# Patient Record
Sex: Male | Born: 1995 | Race: Black or African American | Hispanic: No | Marital: Single | Smoking: Never smoker
Health system: Southern US, Community
[De-identification: ages and names within clinical notes are randomized; demographics above are authoritative.]

## PROBLEM LIST (undated history)

## (undated) DIAGNOSIS — R6250 Unspecified lack of expected normal physiological development in childhood: Secondary | ICD-10-CM

## (undated) DIAGNOSIS — L309 Dermatitis, unspecified: Secondary | ICD-10-CM

## (undated) DIAGNOSIS — E049 Nontoxic goiter, unspecified: Secondary | ICD-10-CM

## (undated) DIAGNOSIS — E11649 Type 2 diabetes mellitus with hypoglycemia without coma: Secondary | ICD-10-CM

## (undated) HISTORY — DX: Type 2 diabetes mellitus with hypoglycemia without coma: E11.649

## (undated) HISTORY — DX: Nontoxic goiter, unspecified: E04.9

## (undated) HISTORY — DX: Unspecified lack of expected normal physiological development in childhood: R62.50

## (undated) HISTORY — DX: Dermatitis, unspecified: L30.9

---
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
## 2017-05-26 ENCOUNTER — Encounter: Payer: Self-pay | Admitting: Primary Care

## 2017-05-26 ENCOUNTER — Ambulatory Visit (INDEPENDENT_AMBULATORY_CARE_PROVIDER_SITE_OTHER): Payer: 59 | Admitting: Primary Care

## 2017-05-26 VITALS — BP 118/76 | HR 81 | Temp 98.2°F

## 2017-05-26 DIAGNOSIS — E109 Type 1 diabetes mellitus without complications: Secondary | ICD-10-CM

## 2017-05-26 LAB — HEMOGLOBIN A1C: Hgb A1c MFr Bld: 9.1 % — ABNORMAL HIGH (ref 4.6–6.5)

## 2017-05-26 NOTE — Patient Instructions (Signed)
Stop by the front desk and speak with either Shirlee Limerick or Zella Ball regarding your referral to endocrinology.  Complete lab work prior to leaving today. I will notify you of your results once received.   It was a pleasure to meet you today! Please don't hesitate to call me with any questions. Welcome to [facility]!

## 2017-05-26 NOTE — Progress Notes (Signed)
   Subjective:    Patient ID: Fred Acevedo, male    DOB: 28-Feb-1996, 21 y.o.   MRN: [id number]  HPI  Fred Acevedo is a 21 year old male who presents today to establish care and discuss the problems mentioned below. Will obtain old records. He moved from [place] recently.  1) Type 1 Diabetes: Currently manage on Lantus 50 units at bedtime and Novolog 1 unit per 8 carbs three times daily. His last A1C was in May 2018 and was 10.   He's checking his blood sugars twice every two days (fasting in the morning, and sometime during the day before a meal). His sugars are ranging low 100's to mid 200's. His highest blood sugar has been 280 and his lowest sugar as been 107. He denies chest pain, SOB, numbness/tingling, dizziness.   Diet currently consists of:  Breakfast: Eggs, bacon, grits Lunch: Sandwiches Dinner: Chicken, fish, veggies, rice Snacks: Cookies, fruit, crackers Desserts: 2-3 times weekly Beverages: Water mostly, crystal light, occasional sweet tea  Exercise: He exercises at the gym twice weekly.     Review of Systems  Eyes: Negative for visual disturbance.  Respiratory: Negative for shortness of breath.   Cardiovascular: Negative for chest pain.  Skin: Negative for color change.  Neurological: Negative for dizziness, weakness, numbness and headaches.       Past Medical History:  Diagnosis Date  . Diabetes mellitus   . Eczema   . Goiter   . Hypoglycemia associated with diabetes (HCC)   . Hypoglycemia associated with diabetes (HCC)   . Physical growth delay      Social History   Social History  . Marital status: Single    Spouse name: N/A  . Number of children: N/A  . Years of education: N/A   Occupational History  . Not on file.   Social History Main Topics  . Smoking status: Never Smoker  . Smokeless tobacco: Never Used  . Alcohol use Not on file  . Drug use: Unknown  . Sexual activity: Not on file   Other Topics Concern  . Not on file   Social  History Narrative   Lives with great aunt.    Moved back form [place].   Works at [employer].   Enjoys playing video games.     No past surgical history on file.  Family History  Problem Relation Age of Onset  . Stroke Mother   . Sickle cell anemia Mother   . Diabetes Maternal Grandmother   . Hypothyroidism Maternal Grandmother   . Tourette syndrome Brother   . Lupus Sister     No Known Allergies  No current outpatient prescriptions on file prior to visit.   No current facility-administered medications on file prior to visit.     BP 118/76   Pulse 81   Temp 98.2 F (36.8 C) (Oral)   SpO2 96%    Objective:   Physical Exam  Constitutional: He is oriented to person, place, and time. He appears well-nourished.  Neck: Neck supple.  Cardiovascular: Normal rate and regular rhythm.   Pulmonary/Chest: Effort normal and breath sounds normal. He has no wheezes. He has no rales.  Neurological: He is alert and oriented to person, place, and time.  Skin: Skin is warm and dry.  Psychiatric: He has a normal mood and affect.          Assessment & Plan:

## 2017-05-26 NOTE — Assessment & Plan Note (Signed)
A1C of 10 in May 2018 per patient.  Blood sugars seem mostly controlled in the low to mid 100's, intermittent numbers in the 200's.  Check A1C today. Referral placed to endocrinology for establishment and treatment.  Discussed to work on diet, increase exercise. Discussed to start monitoring sugars QID.

## 2017-05-27 ENCOUNTER — Encounter: Payer: Self-pay | Admitting: *Deleted

## 2024-04-12 ENCOUNTER — Emergency Department (HOSPITAL_COMMUNITY): Payer: Self-pay

## 2024-04-12 ENCOUNTER — Encounter (HOSPITAL_COMMUNITY): Payer: Self-pay

## 2024-04-12 ENCOUNTER — Other Ambulatory Visit: Payer: Self-pay

## 2024-04-12 ENCOUNTER — Inpatient Hospital Stay (HOSPITAL_COMMUNITY)
Admission: EM | Admit: 2024-04-12 | Discharge: 2024-04-15 | DRG: 637 | Disposition: A | Discharge status: Home / Self Care | Payer: Self-pay | Attending: Internal Medicine | Admitting: Internal Medicine

## 2024-04-12 DIAGNOSIS — R000 Tachycardia, unspecified: Secondary | ICD-10-CM | POA: Diagnosis present

## 2024-04-12 DIAGNOSIS — R4182 Altered mental status, unspecified: Secondary | ICD-10-CM | POA: Diagnosis not present

## 2024-04-12 DIAGNOSIS — Z5902 Unsheltered homelessness: Secondary | ICD-10-CM

## 2024-04-12 DIAGNOSIS — E1011 Type 1 diabetes mellitus with ketoacidosis with coma: Principal | ICD-10-CM | POA: Diagnosis present

## 2024-04-12 DIAGNOSIS — Z832 Family history of diseases of the blood and blood-forming organs and certain disorders involving the immune mechanism: Secondary | ICD-10-CM

## 2024-04-12 DIAGNOSIS — E1065 Type 1 diabetes mellitus with hyperglycemia: Secondary | ICD-10-CM | POA: Diagnosis present

## 2024-04-12 DIAGNOSIS — L309 Dermatitis, unspecified: Secondary | ICD-10-CM | POA: Diagnosis present

## 2024-04-12 DIAGNOSIS — H547 Unspecified visual loss: Secondary | ICD-10-CM | POA: Diagnosis present

## 2024-04-12 DIAGNOSIS — E10649 Type 1 diabetes mellitus with hypoglycemia without coma: Secondary | ICD-10-CM | POA: Diagnosis not present

## 2024-04-12 DIAGNOSIS — E876 Hypokalemia: Secondary | ICD-10-CM | POA: Diagnosis not present

## 2024-04-12 DIAGNOSIS — J9601 Acute respiratory failure with hypoxia: Secondary | ICD-10-CM | POA: Diagnosis present

## 2024-04-12 DIAGNOSIS — Z823 Family history of stroke: Secondary | ICD-10-CM

## 2024-04-12 DIAGNOSIS — G9341 Metabolic encephalopathy: Secondary | ICD-10-CM | POA: Diagnosis present

## 2024-04-12 DIAGNOSIS — E049 Nontoxic goiter, unspecified: Secondary | ICD-10-CM | POA: Diagnosis present

## 2024-04-12 DIAGNOSIS — D72829 Elevated white blood cell count, unspecified: Secondary | ICD-10-CM | POA: Diagnosis present

## 2024-04-12 DIAGNOSIS — E875 Hyperkalemia: Secondary | ICD-10-CM | POA: Diagnosis present

## 2024-04-12 DIAGNOSIS — R509 Fever, unspecified: Secondary | ICD-10-CM | POA: Diagnosis present

## 2024-04-12 DIAGNOSIS — E1110 Type 2 diabetes mellitus with ketoacidosis without coma: Secondary | ICD-10-CM | POA: Diagnosis present

## 2024-04-12 DIAGNOSIS — Z794 Long term (current) use of insulin: Secondary | ICD-10-CM

## 2024-04-12 DIAGNOSIS — Z833 Family history of diabetes mellitus: Secondary | ICD-10-CM

## 2024-04-12 DIAGNOSIS — E1040 Type 1 diabetes mellitus with diabetic neuropathy, unspecified: Secondary | ICD-10-CM | POA: Diagnosis present

## 2024-04-12 DIAGNOSIS — N179 Acute kidney failure, unspecified: Secondary | ICD-10-CM | POA: Diagnosis present

## 2024-04-12 DIAGNOSIS — Z5971 Insufficient health insurance coverage: Secondary | ICD-10-CM

## 2024-04-12 LAB — CBC WITH DIFFERENTIAL/PLATELET
Basophils Absolute: 0 K/uL (ref 0.0–0.1)
Basophils Relative: 0 %
Eosinophils Absolute: 0 K/uL (ref 0.0–0.5)
Eosinophils Relative: 0 %
HCT: 42.9 % (ref 39.0–52.0)
Hemoglobin: 14.5 g/dL (ref 13.0–17.0)
Lymphocytes Relative: 6 %
Lymphs Abs: 1.8 K/uL (ref 0.7–4.0)
MCH: 30.4 pg (ref 26.0–34.0)
MCHC: 33.8 g/dL (ref 30.0–36.0)
MCV: 89.9 fL (ref 80.0–100.0)
Monocytes Absolute: 2.8 K/uL — ABNORMAL HIGH (ref 0.1–1.0)
Monocytes Relative: 9 %
Neutro Abs: 26 K/uL — ABNORMAL HIGH (ref 1.7–7.7)
Neutrophils Relative %: 85 %
Platelets: 296 K/uL (ref 150–400)
RBC: 4.77 MIL/uL (ref 4.22–5.81)
RDW: 11.2 % — ABNORMAL LOW (ref 11.5–15.5)
WBC: 30.6 K/uL — ABNORMAL HIGH (ref 4.0–10.5)
nRBC: 0 % (ref 0.0–0.2)

## 2024-04-12 LAB — COMPREHENSIVE METABOLIC PANEL WITH GFR
ALT: 29 U/L (ref 0–44)
AST: 33 U/L (ref 15–41)
Albumin: 4.6 g/dL (ref 3.5–5.0)
Alkaline Phosphatase: 76 U/L (ref 38–126)
BUN: 19 mg/dL (ref 6–20)
CO2: <7 mmol/L — ABNORMAL LOW (ref 22–32)
Calcium: 9.7 mg/dL (ref 8.9–10.3)
Chloride: 96 mmol/L — ABNORMAL LOW (ref 98–111)
Creatinine, Ser: 1.98 mg/dL — ABNORMAL HIGH (ref 0.61–1.24)
GFR, Estimated: 47 mL/min — ABNORMAL LOW (ref >60)
Glucose, Bld: 558 mg/dL (ref 70–99)
Potassium: 6 mmol/L — ABNORMAL HIGH (ref 3.5–5.1)
Sodium: 132 mmol/L — ABNORMAL LOW (ref 135–145)
Total Bilirubin: 2.6 mg/dL — ABNORMAL HIGH (ref 0.0–1.2)
Total Protein: 8 g/dL (ref 6.5–8.1)

## 2024-04-12 LAB — RAPID URINE DRUG SCREEN, HOSP PERFORMED
Amphetamines: NOT DETECTED
Barbiturates: NOT DETECTED
Benzodiazepines: NOT DETECTED
Cocaine: NOT DETECTED
Opiates: NOT DETECTED
Tetrahydrocannabinol: POSITIVE — AB

## 2024-04-12 LAB — BETA-HYDROXYBUTYRIC ACID
Beta-Hydroxybutyric Acid: >8 mmol/L — ABNORMAL HIGH (ref 0.05–0.27)
Beta-Hydroxybutyric Acid: 7.96 mmol/L — ABNORMAL HIGH (ref 0.05–0.27)
Beta-Hydroxybutyric Acid: 0.38 mmol/L — ABNORMAL HIGH (ref 0.05–0.27)

## 2024-04-12 LAB — CBC
HCT: 38.3 % — ABNORMAL LOW (ref 39.0–52.0)
Hemoglobin: 13 g/dL (ref 13.0–17.0)
MCH: 30.1 pg (ref 26.0–34.0)
MCHC: 33.9 g/dL (ref 30.0–36.0)
MCV: 88.7 fL (ref 80.0–100.0)
Platelets: 297 K/uL (ref 150–400)
RBC: 4.32 MIL/uL (ref 4.22–5.81)
RDW: 11.2 % — ABNORMAL LOW (ref 11.5–15.5)
WBC: 38.1 K/uL — ABNORMAL HIGH (ref 4.0–10.5)
nRBC: 0 % (ref 0.0–0.2)

## 2024-04-12 LAB — PHOSPHORUS: Phosphorus: 2 mg/dL — ABNORMAL LOW (ref 2.5–4.6)

## 2024-04-12 LAB — CBG MONITORING, ED
Glucose-Capillary: 530 mg/dL (ref 70–99)
Glucose-Capillary: 521 mg/dL (ref 70–99)
Glucose-Capillary: 586 mg/dL (ref 70–99)
Glucose-Capillary: 542 mg/dL (ref 70–99)
Glucose-Capillary: 592 mg/dL (ref 70–99)
Glucose-Capillary: 561 mg/dL (ref 70–99)
Glucose-Capillary: >600 mg/dL (ref 70–99)

## 2024-04-12 LAB — URINALYSIS, ROUTINE W REFLEX MICROSCOPIC
Bacteria, UA: NONE SEEN
Bilirubin Urine: NEGATIVE
Glucose, UA: >=500 mg/dL — AB
Ketones, ur: 80 mg/dL — AB
Leukocytes,Ua: NEGATIVE
Nitrite: NEGATIVE
Protein, ur: 100 mg/dL — AB
Specific Gravity, Urine: 1.013 (ref 1.005–1.030)
pH: 5 (ref 5.0–8.0)

## 2024-04-12 LAB — BASIC METABOLIC PANEL WITH GFR
Anion gap: 27 — ABNORMAL HIGH (ref 5–15)
Anion gap: 22 — ABNORMAL HIGH (ref 5–15)
Anion gap: 18 — ABNORMAL HIGH (ref 5–15)
Anion gap: 10 (ref 5–15)
Anion gap: 10 (ref 5–15)
BUN: 27 mg/dL — ABNORMAL HIGH (ref 6–20)
BUN: 29 mg/dL — ABNORMAL HIGH (ref 6–20)
BUN: 31 mg/dL — ABNORMAL HIGH (ref 6–20)
BUN: 26 mg/dL — ABNORMAL HIGH (ref 6–20)
BUN: 24 mg/dL — ABNORMAL HIGH (ref 6–20)
CO2: 7 mmol/L — ABNORMAL LOW (ref 22–32)
CO2: 10 mmol/L — ABNORMAL LOW (ref 22–32)
CO2: 13 mmol/L — ABNORMAL LOW (ref 22–32)
CO2: 18 mmol/L — ABNORMAL LOW (ref 22–32)
CO2: 18 mmol/L — ABNORMAL LOW (ref 22–32)
Calcium: 8.8 mg/dL — ABNORMAL LOW (ref 8.9–10.3)
Calcium: 8.9 mg/dL (ref 8.9–10.3)
Calcium: 8.6 mg/dL — ABNORMAL LOW (ref 8.9–10.3)
Calcium: 8.6 mg/dL — ABNORMAL LOW (ref 8.9–10.3)
Calcium: 8.6 mg/dL — ABNORMAL LOW (ref 8.9–10.3)
Chloride: 97 mmol/L — ABNORMAL LOW (ref 98–111)
Chloride: 100 mmol/L (ref 98–111)
Chloride: 99 mmol/L (ref 98–111)
Chloride: 108 mmol/L (ref 98–111)
Chloride: 109 mmol/L (ref 98–111)
Creatinine, Ser: 2.48 mg/dL — ABNORMAL HIGH (ref 0.61–1.24)
Creatinine, Ser: 2.54 mg/dL — ABNORMAL HIGH (ref 0.61–1.24)
Creatinine, Ser: 2.25 mg/dL — ABNORMAL HIGH (ref 0.61–1.24)
Creatinine, Ser: 1.54 mg/dL — ABNORMAL HIGH (ref 0.61–1.24)
Creatinine, Ser: 1.37 mg/dL — ABNORMAL HIGH (ref 0.61–1.24)
GFR, Estimated: 36 mL/min — ABNORMAL LOW (ref >60)
GFR, Estimated: 35 mL/min — ABNORMAL LOW (ref >60)
GFR, Estimated: 40 mL/min — ABNORMAL LOW (ref >60)
GFR, Estimated: >60 mL/min (ref >60)
GFR, Estimated: >60 mL/min (ref >60)
Glucose, Bld: 537 mg/dL (ref 70–99)
Glucose, Bld: 428 mg/dL — ABNORMAL HIGH (ref 70–99)
Glucose, Bld: 310 mg/dL — ABNORMAL HIGH (ref 70–99)
Glucose, Bld: 183 mg/dL — ABNORMAL HIGH (ref 70–99)
Glucose, Bld: 134 mg/dL — ABNORMAL HIGH (ref 70–99)
Potassium: 4.5 mmol/L (ref 3.5–5.1)
Potassium: 4 mmol/L (ref 3.5–5.1)
Potassium: 3.4 mmol/L — ABNORMAL LOW (ref 3.5–5.1)
Potassium: 4 mmol/L (ref 3.5–5.1)
Potassium: 5 mmol/L (ref 3.5–5.1)
Sodium: 131 mmol/L — ABNORMAL LOW (ref 135–145)
Sodium: 132 mmol/L — ABNORMAL LOW (ref 135–145)
Sodium: 130 mmol/L — ABNORMAL LOW (ref 135–145)
Sodium: 136 mmol/L (ref 135–145)
Sodium: 137 mmol/L (ref 135–145)

## 2024-04-12 LAB — POCT I-STAT 7, (LYTES, BLD GAS, ICA,H+H)
Acid-base deficit: 22 mmol/L — ABNORMAL HIGH (ref 0.0–2.0)
Bicarbonate: 6 mmol/L — ABNORMAL LOW (ref 20.0–28.0)
Calcium, Ion: 1.21 mmol/L (ref 1.15–1.40)
HCT: 39 % (ref 39.0–52.0)
Hemoglobin: 13.3 g/dL (ref 13.0–17.0)
O2 Saturation: 100 %
Patient temperature: 97.6
Potassium: 4.5 mmol/L (ref 3.5–5.1)
Sodium: 130 mmol/L — ABNORMAL LOW (ref 135–145)
TCO2: 7 mmol/L — ABNORMAL LOW (ref 22–32)
pCO2 arterial: 20 mmHg — ABNORMAL LOW (ref 32–48)
pH, Arterial: 7.082 — CL (ref 7.35–7.45)
pO2, Arterial: 612 mmHg — ABNORMAL HIGH (ref 83–108)

## 2024-04-12 LAB — GLUCOSE, CAPILLARY
Glucose-Capillary: 509 mg/dL (ref 70–99)
Glucose-Capillary: 517 mg/dL (ref 70–99)
Glucose-Capillary: 457 mg/dL — ABNORMAL HIGH (ref 70–99)
Glucose-Capillary: 540 mg/dL (ref 70–99)
Glucose-Capillary: 466 mg/dL — ABNORMAL HIGH (ref 70–99)
Glucose-Capillary: 422 mg/dL — ABNORMAL HIGH (ref 70–99)
Glucose-Capillary: 424 mg/dL — ABNORMAL HIGH (ref 70–99)
Glucose-Capillary: 386 mg/dL — ABNORMAL HIGH (ref 70–99)
Glucose-Capillary: 317 mg/dL — ABNORMAL HIGH (ref 70–99)
Glucose-Capillary: 298 mg/dL — ABNORMAL HIGH (ref 70–99)
Glucose-Capillary: 224 mg/dL — ABNORMAL HIGH (ref 70–99)
Glucose-Capillary: 203 mg/dL — ABNORMAL HIGH (ref 70–99)
Glucose-Capillary: 213 mg/dL — ABNORMAL HIGH (ref 70–99)
Glucose-Capillary: 192 mg/dL — ABNORMAL HIGH (ref 70–99)
Glucose-Capillary: 168 mg/dL — ABNORMAL HIGH (ref 70–99)
Glucose-Capillary: 149 mg/dL — ABNORMAL HIGH (ref 70–99)
Glucose-Capillary: 133 mg/dL — ABNORMAL HIGH (ref 70–99)
Glucose-Capillary: 113 mg/dL — ABNORMAL HIGH (ref 70–99)
Glucose-Capillary: 103 mg/dL — ABNORMAL HIGH (ref 70–99)
Glucose-Capillary: 117 mg/dL — ABNORMAL HIGH (ref 70–99)
Glucose-Capillary: 129 mg/dL — ABNORMAL HIGH (ref 70–99)

## 2024-04-12 LAB — I-STAT CHEM 8, ED
BUN: 19 mg/dL (ref 6–20)
Calcium, Ion: 1.1 mmol/L — ABNORMAL LOW (ref 1.15–1.40)
Chloride: 105 mmol/L (ref 98–111)
Creatinine, Ser: 1.4 mg/dL — ABNORMAL HIGH (ref 0.61–1.24)
Glucose, Bld: 589 mg/dL (ref 70–99)
HCT: 45 % (ref 39.0–52.0)
Hemoglobin: 15.3 g/dL (ref 13.0–17.0)
Potassium: 5.7 mmol/L — ABNORMAL HIGH (ref 3.5–5.1)
Sodium: 132 mmol/L — ABNORMAL LOW (ref 135–145)
TCO2: 7 mmol/L — ABNORMAL LOW (ref 22–32)

## 2024-04-12 LAB — I-STAT VENOUS BLOOD GAS, ED
Acid-base deficit: 25 mmol/L — ABNORMAL HIGH (ref 0.0–2.0)
Bicarbonate: 4.3 mmol/L — ABNORMAL LOW (ref 20.0–28.0)
Calcium, Ion: 1.15 mmol/L (ref 1.15–1.40)
HCT: 46 % (ref 39.0–52.0)
Hemoglobin: 15.6 g/dL (ref 13.0–17.0)
O2 Saturation: 87 %
Potassium: 5.6 mmol/L — ABNORMAL HIGH (ref 3.5–5.1)
Sodium: 131 mmol/L — ABNORMAL LOW (ref 135–145)
TCO2: <5 mmol/L — ABNORMAL LOW (ref 22–32)
pCO2, Ven: 16.6 mmHg — CL (ref 44–60)
pH, Ven: 7.022 — CL (ref 7.25–7.43)
pO2, Ven: 76 mmHg — ABNORMAL HIGH (ref 32–45)

## 2024-04-12 LAB — HIV ANTIBODY (ROUTINE TESTING W REFLEX): HIV Screen 4th Generation wRfx: NONREACTIVE

## 2024-04-12 LAB — MAGNESIUM: Magnesium: 1.7 mg/dL (ref 1.7–2.4)

## 2024-04-12 LAB — MRSA NEXT GEN BY PCR, NASAL: MRSA by PCR Next Gen: NOT DETECTED

## 2024-04-12 LAB — TROPONIN I (HIGH SENSITIVITY)
Troponin I (High Sensitivity): 27 ng/L — ABNORMAL HIGH (ref <18)
Troponin I (High Sensitivity): 38 ng/L — ABNORMAL HIGH (ref <18)

## 2024-04-12 LAB — ETHANOL: Alcohol, Ethyl (B): <15 mg/dL (ref <15)

## 2024-04-12 MED ORDER — POTASSIUM CHLORIDE 10 MEQ/100ML IV SOLN
10.0000 meq | INTRAVENOUS | Status: AC
  Administered 2024-04-12 (×4): 10 meq via INTRAVENOUS
  Filled 2024-04-12 (×4): qty 100

## 2024-04-12 MED ORDER — MAGNESIUM SULFATE 2 GM/50ML IV SOLN
2.0000 g | Freq: Once | INTRAVENOUS | Status: AC
  Administered 2024-04-12: 2 g via INTRAVENOUS
  Filled 2024-04-12: qty 50

## 2024-04-12 MED ORDER — DIAZEPAM 5 MG/ML IJ SOLN
2.5000 mg | Freq: Once | INTRAMUSCULAR | Status: AC
  Administered 2024-04-12: 2.5 mg via INTRAVENOUS
  Filled 2024-04-12: qty 2

## 2024-04-12 MED ORDER — FAMOTIDINE 20 MG PO TABS
20.0000 mg | ORAL_TABLET | Freq: Two times a day (BID) | ORAL | Status: DC
  Administered 2024-04-12 – 2024-04-13 (×4): 20 mg
  Filled 2024-04-12 (×4): qty 1

## 2024-04-12 MED ORDER — HEPARIN SODIUM (PORCINE) 5000 UNIT/ML IJ SOLN: 5000.0000 [IU] | Freq: Three times a day (TID) | INTRAMUSCULAR | Status: DC

## 2024-04-12 MED ORDER — HEPARIN SODIUM (PORCINE) 5000 UNIT/ML IJ SOLN
5000.0000 [IU] | Freq: Three times a day (TID) | INTRAMUSCULAR | Status: DC
  Administered 2024-04-12: 5000 [IU] via SUBCUTANEOUS
  Filled 2024-04-12: qty 1

## 2024-04-12 MED ORDER — ORAL CARE MOUTH RINSE: 15.0000 mL | OROMUCOSAL | Status: DC | PRN

## 2024-04-12 MED ORDER — FENTANYL 2500MCG IN NS 250ML (10MCG/ML) PREMIX INFUSION
0.0000 ug/h | INTRAVENOUS | Status: DC
  Administered 2024-04-12: 150 ug/h via INTRAVENOUS
  Administered 2024-04-12: 25 ug/h via INTRAVENOUS
  Filled 2024-04-12 (×2): qty 250

## 2024-04-12 MED ORDER — LACTATED RINGERS IV BOLUS: 20.0000 mL/kg | Freq: Once | INTRAVENOUS | Status: DC

## 2024-04-12 MED ORDER — FENTANYL CITRATE PF 50 MCG/ML IJ SOSY
25.0000 ug | PREFILLED_SYRINGE | Freq: Once | INTRAMUSCULAR | Status: AC
  Administered 2024-04-12: 25 ug via INTRAVENOUS

## 2024-04-12 MED ORDER — LACTATED RINGERS IV SOLN: INTRAVENOUS | Status: DC

## 2024-04-12 MED ORDER — DEXTROSE IN LACTATED RINGERS 5 % IV SOLN: INTRAVENOUS | Status: DC

## 2024-04-12 MED ORDER — POTASSIUM & SODIUM PHOSPHATES 280-160-250 MG PO PACK
2.0000 | PACK | ORAL | Status: AC
  Administered 2024-04-12 – 2024-04-13 (×3): 2
  Filled 2024-04-12 (×3): qty 2

## 2024-04-12 MED ORDER — PROPOFOL 1000 MG/100ML IV EMUL
0.0000 ug/kg/min | INTRAVENOUS | Status: DC
  Administered 2024-04-12: 60 ug/kg/min via INTRAVENOUS
  Administered 2024-04-12 (×2): 70 ug/kg/min via INTRAVENOUS
  Administered 2024-04-12: 60 ug/kg/min via INTRAVENOUS
  Administered 2024-04-12 (×2): 70 ug/kg/min via INTRAVENOUS
  Administered 2024-04-12: 5 ug/kg/min via INTRAVENOUS
  Administered 2024-04-13: 20 ug/kg/min via INTRAVENOUS
  Filled 2024-04-12 (×6): qty 100
  Filled 2024-04-12: qty 200
  Filled 2024-04-12: qty 100

## 2024-04-12 MED ORDER — LACTATED RINGERS IV BOLUS
20.0000 mL/kg | Freq: Once | INTRAVENOUS | Status: AC
  Administered 2024-04-12: 1632 mL via INTRAVENOUS

## 2024-04-12 MED ORDER — DEXTROSE 50 % IV SOLN: 0.0000 mL | INTRAVENOUS | Status: DC | PRN

## 2024-04-12 MED ORDER — FENTANYL CITRATE PF 50 MCG/ML IJ SOSY
50.0000 ug | PREFILLED_SYRINGE | INTRAMUSCULAR | Status: DC | PRN
  Administered 2024-04-12: 100 ug via INTRAVENOUS
  Administered 2024-04-12: 200 ug via INTRAVENOUS
  Filled 2024-04-12: qty 3
  Filled 2024-04-12: qty 2

## 2024-04-12 MED ORDER — KCL-LACTATED RINGERS-D5W 20 MEQ/L IV SOLN
INTRAVENOUS | Status: DC
  Filled 2024-04-12 (×3): qty 1000

## 2024-04-12 MED ORDER — POTASSIUM CHLORIDE 2 MEQ/ML IV SOLN
INTRAVENOUS | Status: DC
  Filled 2024-04-12: qty 1000

## 2024-04-12 MED ORDER — ORAL CARE MOUTH RINSE: 15.0000 mL | OROMUCOSAL | Status: DC

## 2024-04-12 MED ORDER — STERILE WATER FOR INJECTION IV SOLN
INTRAVENOUS | Status: DC
  Filled 2024-04-12: qty 1000

## 2024-04-12 MED ORDER — POTASSIUM CHLORIDE 20 MEQ PO PACK
60.0000 meq | PACK | Freq: Once | ORAL | Status: AC
  Administered 2024-04-12: 60 meq
  Filled 2024-04-12: qty 3

## 2024-04-12 MED ORDER — ORAL CARE MOUTH RINSE
15.0000 mL | OROMUCOSAL | Status: DC
  Administered 2024-04-12 – 2024-04-14 (×32): 15 mL via OROMUCOSAL

## 2024-04-12 MED ORDER — ETOMIDATE 2 MG/ML IV SOLN
20.0000 mg | Freq: Once | INTRAVENOUS | Status: AC
  Administered 2024-04-12: 20 mg via INTRAVENOUS
  Filled 2024-04-12: qty 10

## 2024-04-12 MED ORDER — LACTATED RINGERS IV BOLUS
2000.0000 mL | Freq: Once | INTRAVENOUS | Status: AC
  Administered 2024-04-12: 2000 mL via INTRAVENOUS

## 2024-04-12 MED ORDER — INSULIN REGULAR(HUMAN) IN NACL 100-0.9 UT/100ML-% IV SOLN
INTRAVENOUS | Status: DC
  Administered 2024-04-12: 9.5 [IU]/h via INTRAVENOUS
  Administered 2024-04-12: 26 [IU]/h via INTRAVENOUS
  Administered 2024-04-12: 17 [IU]/h via INTRAVENOUS
  Administered 2024-04-12: 12 [IU]/h via INTRAVENOUS
  Administered 2024-04-12: 16 [IU]/h via INTRAVENOUS
  Administered 2024-04-12: 31 [IU]/h via INTRAVENOUS
  Filled 2024-04-12 (×4): qty 100

## 2024-04-12 MED ORDER — MIDAZOLAM HCL 2 MG/2ML IJ SOLN
2.0000 mg | INTRAMUSCULAR | Status: DC | PRN
  Administered 2024-04-12 (×2): 2 mg via INTRAVENOUS
  Filled 2024-04-12 (×3): qty 2

## 2024-04-12 MED ORDER — ETOMIDATE 2 MG/ML IV SOLN
10.0000 mg | Freq: Once | INTRAVENOUS | Status: AC
  Administered 2024-04-12: 10 mg via INTRAVENOUS
  Filled 2024-04-12: qty 10

## 2024-04-12 MED ORDER — PROCHLORPERAZINE EDISYLATE 10 MG/2ML IJ SOLN
10.0000 mg | Freq: Once | INTRAMUSCULAR | Status: AC
  Administered 2024-04-12: 10 mg via INTRAVENOUS
  Filled 2024-04-12: qty 2

## 2024-04-12 MED ORDER — POLYETHYLENE GLYCOL 3350 17 G PO PACK
17.0000 g | PACK | Freq: Every day | ORAL | Status: DC
  Administered 2024-04-12 – 2024-04-13 (×2): 17 g
  Filled 2024-04-12 (×2): qty 1

## 2024-04-12 MED ORDER — FENTANYL BOLUS VIA INFUSION
25.0000 ug | INTRAVENOUS | Status: DC | PRN
  Administered 2024-04-12: 100 ug via INTRAVENOUS
  Administered 2024-04-13: 50 ug via INTRAVENOUS

## 2024-04-12 MED ORDER — POTASSIUM CHLORIDE 2 MEQ/ML IV SOLN
INTRAVENOUS | Status: DC
  Filled 2024-04-12 (×3): qty 1000

## 2024-04-12 MED ORDER — ROCURONIUM BROMIDE 10 MG/ML (PF) SYRINGE
100.0000 mg | PREFILLED_SYRINGE | Freq: Once | INTRAVENOUS | Status: AC
  Administered 2024-04-12: 100 mg via INTRAVENOUS
  Filled 2024-04-12: qty 10

## 2024-04-12 MED ORDER — POTASSIUM CHLORIDE 10 MEQ/100ML IV SOLN: 10.0000 meq | INTRAVENOUS | Status: DC

## 2024-04-12 MED ORDER — ONDANSETRON HCL 4 MG/2ML IJ SOLN
4.0000 mg | Freq: Four times a day (QID) | INTRAMUSCULAR | Status: DC | PRN
Start: 2024-04-12 — End: 2024-04-15

## 2024-04-12 MED ORDER — ENOXAPARIN SODIUM 40 MG/0.4ML IJ SOSY
40.0000 mg | PREFILLED_SYRINGE | INTRAMUSCULAR | Status: DC
  Administered 2024-04-12 – 2024-04-15 (×4): 40 mg via SUBCUTANEOUS
  Filled 2024-04-12 (×4): qty 0.4

## 2024-04-12 MED ORDER — INSULIN REGULAR(HUMAN) IN NACL 100-0.9 UT/100ML-% IV SOLN: INTRAVENOUS | Status: DC

## 2024-04-12 MED ORDER — FENTANYL CITRATE PF 50 MCG/ML IJ SOSY
50.0000 ug | PREFILLED_SYRINGE | INTRAMUSCULAR | Status: DC | PRN
  Administered 2024-04-12: 50 ug via INTRAVENOUS
  Filled 2024-04-12 (×2): qty 1

## 2024-04-12 MED ORDER — CHLORHEXIDINE GLUCONATE CLOTH 2 % EX PADS
6.0000 | MEDICATED_PAD | Freq: Every day | CUTANEOUS | Status: DC
  Administered 2024-04-12 – 2024-04-15 (×4): 6 via TOPICAL

## 2024-04-12 MED ORDER — DOCUSATE SODIUM 50 MG/5ML PO LIQD
100.0000 mg | Freq: Two times a day (BID) | ORAL | Status: DC
  Administered 2024-04-12 – 2024-04-13 (×4): 100 mg
  Filled 2024-04-12 (×4): qty 10

## 2024-04-12 NOTE — ED Notes (Signed)
 Patient CBG 592. Trine, MD notified. Insulin  dripp increased to 22units/hr per Endotool

## 2024-04-12 NOTE — Inpatient Diabetes Management (Signed)
 Inpatient Diabetes Program Recommendations  AACE/ADA: New Consensus Statement on Inpatient Glycemic Control (2025)  Target Ranges:  Prepandial:   less than 140 mg/dL      Peak postprandial:   less than 180 mg/dL (1-2 hours)      Critically ill patients:  140 - 180 mg/dL   Lab Results  Component Value Date   GLUCAP 386 (H) 04/12/2024   HGBA1C 9.1 (H) 05/26/2017    Review of Glycemic Control  Latest Reference Range & Units 04/12/24 07:29 04/12/24 08:03 04/12/24 08:35 04/12/24 09:08 04/12/24 09:55  Glucose-Capillary 70 - 99 mg/dL 459 (HH) 533 (H) 577 (H) 424 (H) 386 (H)   Diabetes history: Type 1 DM  Outpatient Diabetes medications:  Meds in 2018:  Lantus  45 units daily, Novolog  1 unit/8 grams of CHO plus  correction Current orders for Inpatient glycemic control:  IV insulin - DKA order set Inpatient Diabetes Program Recommendations:    Agree with current orders.  Insulin  needs appear to be high and patient is currently intubated.  Continue hourly adjustments and add dextrose  to IV fluids once CBG's 250 mg/dL.  Will need to speak to patient when appropriate.    Thanks,  Randall Bullocks, RN, BC-ADM Inpatient Diabetes Coordinator Pager [phone]  (8a-5p)

## 2024-04-12 NOTE — Progress Notes (Signed)
 eLink Physician-Brief Progress Note Patient Name: Fred Acevedo DOB: 09-01-1996 MRN: [id number]   Date of Service  04/12/2024  HPI/Events of Note  24 male with type 1 diabetes presented with hyperglycemia subsequently diagnosed with DKA.   Endo tool recommending transition off of insulin  drip.  Bicarb improved.  Gap closed.  Beta-hydroxybutyrate still elevated.  eICU Interventions  Still intubated, not receiving any enteral nutrition.  Wait to transition until the patient can eat/receives enteral nutrition   0401 - CBG 69, insulin  drip has been paused for an hour.  Switch to insulin  drip for hyperglycemia.  Continue IVF with dextrose .  Intervention Category Minor Interventions: Routine modifications to care plan (e.g. PRN medications for pain, fever)  Sharetta Ricchio 04/12/2024, 8:34 PM

## 2024-04-12 NOTE — Progress Notes (Signed)
   NAME:  Fred Acevedo, MRN:  [id number], DOB:  Jan 07, 1996, LOS: 0 ADMISSION DATE:  04/12/2024, CONSULTATION DATE:  04/12/2024 REFERRING MD:  Raynell Naegeli, MD, CHIEF COMPLAINT:  DKA/Acute resp failure  History of Present Illness:  28 y/o male with PMH for IDDM who was BIBEMS for hyperglycemia.  Apparently had hyperglycemia all day ranging from 350 to 415.  He has run out of his short acting Insulin  and so he took 40 units of his Mother's short acting Insulin .  His Blood glucose remained high.  EMS gave him 400 cc NS en route to ER.   During his ED stay, patient became more altered and would get on the floor.  He was therefore intubated for airways protection.  Pertinent  Medical History  IDDM since age 12  Significant Hospital Events: Including procedures, antibiotic start and stop dates in addition to other pertinent events   8/6:  Admit to ICU  Interim History / Subjective:  NAEON. Remains on Prop, Fent, insulin . Opens eyes on vent.  Objective    Blood pressure 103/68, pulse (!) 223, temperature (!) 97.5 F (36.4 C), temperature source Axillary, resp. rate (!) 9, height 5' 6 (1.676 m), weight 76.4 kg, SpO2 100%.    Vent Mode: PRVC FiO2 (%):  [40 %] 40 % Set Rate:  [15 bmp] 15 bmp Vt Set:  [400 mL-540 mL] 540 mL PEEP:  [5 cmH20] 5 cmH20 Pressure Support:  [5 cmH20] 5 cmH20 Plateau Pressure:  [13 cmH20-14 cmH20] 14 cmH20   Intake/Output Summary (Last 24 hours) at 04/12/2024 0944 Last data filed at 04/12/2024 9073 Gross per 24 hour  Intake 2268.81 ml  Output 50 ml  Net 2218.81 ml   Filed Weights   04/12/24 0053 04/12/24 0500  Weight: 81.6 kg 76.4 kg    Examination: General: young AA male, sedated and intubated, NAD HENT: Pupils reactive no icterus, ETT in place Lungs: CTA no wheezes no rales Cardiovascular: RRR, no M/R/G Abdomen: BS x 4, S/NT/ND Extremities: no cyanosis, clubbing or edema Neuro: sedated and intubated   Assessment and Plan   DKA - 2/2 missed insulin   doses PTA. - Continue insulin  infusion per DKA protocol. - Fluids. - Follow BMP.  Acute hypoxic respiratory failure 2/2 inability to protect the airway - in setting of above. S/p intubation in ED. - Full vent support. - Wean as mental status allows. - Bronchial hygiene.  Acute metabolic encephalopathy - 2/2 above. - Supportive care as above.  Pseudohyponatremia. AGMA. AKI. - Continue fluids. - Follow BMP.  Leukocytosis - presumed reactive, no indication of acute infection. - Defer abx for now. - Follow clinically.   Best Practice (right click and Reselect all SmartList Selections daily)   Diet/type: tubefeeds DVT prophylaxis prophylactic heparin   Pressure ulcer(s): N/A GI prophylaxis: H2B Lines: N/A Foley:  Yes, and it is still needed Code Status:  full code  Critical care time: 30 min.    Sammi Gore, PA - [facility] For pager details, please see AMION or use Epic chat  After 1900, please call [facility] for cross coverage needs 04/12/2024, 9:45 AM

## 2024-04-12 NOTE — Progress Notes (Signed)
 eLink Physician-Brief Progress Note Patient Name: Fred Acevedo DOB: 11-03-1995 MRN: [id number]   Date of Service  04/12/2024  HPI/Events of Note  28 y/o male with PMH for IDDM who was BIBEMS for hyperglycemia and diabetic ketoacidosis, was intubated in the emergency department for airway protection.  Patient is tachycardic but otherwise normal vital signs.  Saturating 100% on 40% FiO2.  He is currently on insulin  and propofol  infusions.  Results consistent with severe metabolic acidosis, hyperkalemia, hyper glycemia and elevated creatinine leukocytosis to 30.  Urinalysis with glucosuria and ketonuria.  eICU Interventions  Maintain DKA protocol with insulin  infusion, crystalloid infusion, and electrolyte repletion  Daily spontaneous awakening/breathing trials.  Anticipate extubation later today.  Add restraints.  Check toxicology  Heparin  for DVT prophylaxis Famotidine  for GI prophylaxis, consider discontinuation as patient is not expected to be intubated for 48 hours     Intervention Category Evaluation Type: New Patient Evaluation  Fred Acevedo 04/12/2024, 6:16 AM

## 2024-04-12 NOTE — ED Notes (Signed)
 0145- This nurse entered patient's room to see patient sitting at end of bed. Patient restless and asking for water . Patient assisted back into the bed. Patient IV dislodged. IV infusions paused. MD notified.  0200- Patient crawling out of bed and into the floor. Patient requesting water . Water  provided per MD. Patient returned to bed. Attempted new IV access x2, unsuccessful.  0221- New IV access established. Infusions resumed.   0230- Patient found at end of bed attempting to get water  from sink in patient's room. Patient assisted back into the bed. Patient restless and attempting to get out of bed and remove PIV and monitoring cords despite staff efforts to calm and reorient the patient. MD aware.

## 2024-04-12 NOTE — ED Provider Notes (Signed)
 [place] [facility] AT [facility] Provider Note  CSN: [id number] Arrival date & time: 04/12/24 0050  Chief Complaint(s) Hyperglycemia  HPI Fred Acevedo is a 28 y.o. male with a history of type 1 diabetes here for elevated blood sugar levels.  Patient reports readings above 400s.  States that he has been out of his short acting insulin  for few weeks due to financial constraints.  He has been taking his Lantus .  Endorses generalized fatigue.  HPI  Past Medical History Past Medical History:  Diagnosis Date  . Diabetes mellitus   . Eczema   . Goiter   . Hypoglycemia associated with diabetes (HCC)   . Hypoglycemia associated with diabetes (HCC)   . Physical growth delay    Patient Active Problem List   Diagnosis Date Noted  . Depression 08/25/2011  . Neuropathy, autonomic 01/16/2011  . Hypoglycemia associated with diabetes (HCC)   . Goiter   . Type 1 diabetes mellitus (HCC) 06/21/2007   Home Medication(s) Prior to Admission medications   Medication Sig Start Date End Date Taking? Authorizing Provider  insulin  aspart (NOVOLOG ) 100 UNIT/ML FlexPen Inject into the skin 3 (three) times daily with meals. Injecting 1 g per 8 carbs    [provider]  Insulin  Glargine (LANTUS ) 100 UNIT/ML Solostar Pen Inject into the skin daily at 10 pm. Injecting 50 g at bedtime    [provider]                                                                                                                                    Allergies Patient has no known allergies.  Review of Systems Review of Systems As noted in HPI  Physical Exam Vital Signs  I have reviewed the triage vital signs BP (!) 172/88   Pulse (!) 116   Resp 19   Ht 5' 6 (1.676 m)   Wt 81.6 kg   SpO2 100%   BMI 29.05 kg/m  He is going to Physical Exam Vitals reviewed.  Constitutional:      General: He is not in acute distress.    Appearance: He is well-developed. He is not  diaphoretic.  HENT:     Head: Normocephalic and atraumatic.     Nose: Nose normal.  Eyes:     General: No scleral icterus.       Right eye: No discharge.        Left eye: No discharge.     Conjunctiva/sclera: Conjunctivae normal.     Pupils: Pupils are equal, round, and reactive to light.  Cardiovascular:     Rate and Rhythm: Normal rate and regular rhythm.     Heart sounds: No murmur heard.    No friction rub. No gallop.  Pulmonary:     Effort: Pulmonary effort is normal. Tachypnea present.     Breath sounds: Normal breath sounds. No stridor. No rales.  Comments: Kussmaul breathing  Abdominal:     General: There is no distension.     Palpations: Abdomen is soft.     Tenderness: There is no abdominal tenderness.  Musculoskeletal:        General: No tenderness.     Cervical back: Normal range of motion and neck supple.  Skin:    General: Skin is warm and dry.     Findings: No erythema or rash.  Neurological:     Mental Status: He is alert and oriented to person, place, and time.     ED Results and Treatments Labs (all labs ordered are listed, but only abnormal results are displayed) Labs Reviewed  BETA-HYDROXYBUTYRIC ACID - Abnormal; Notable for the following components:      Result Value   Beta-Hydroxybutyric Acid >8.00 (*)    All other components within normal limits  CBC WITH DIFFERENTIAL/PLATELET - Abnormal; Notable for the following components:   WBC 30.6 (*)    RDW 11.2 (*)    Neutro Abs 26.0 (*)    Monocytes Absolute 2.8 (*)    All other components within normal limits  COMPREHENSIVE METABOLIC PANEL WITH GFR - Abnormal; Notable for the following components:   Sodium 132 (*)    Potassium 6.0 (*)    Chloride 96 (*)    CO2 <7 (*)    Glucose, Bld 558 (*)    Creatinine, Ser 1.98 (*)    Total Bilirubin 2.6 (*)    GFR, Estimated 47 (*)    All other components within normal limits  CBG MONITORING, ED - Abnormal; Notable for the following components:    Glucose-Capillary 530 (*)    All other components within normal limits  I-STAT VENOUS BLOOD GAS, ED - Abnormal; Notable for the following components:   pH, Ven 7.022 (*)    pCO2, Ven 16.6 (*)    pO2, Ven 76 (*)    Bicarbonate 4.3 (*)    TCO2 <5 (*)    Acid-base deficit 25.0 (*)    Sodium 131 (*)    Potassium 5.6 (*)    All other components within normal limits  I-STAT CHEM 8, ED - Abnormal; Notable for the following components:   Sodium 132 (*)    Potassium 5.7 (*)    Creatinine, Ser 1.40 (*)    Glucose, Bld 589 (*)    Calcium, Ion 1.10 (*)    TCO2 7 (*)    All other components within normal limits  CBG MONITORING, ED - Abnormal; Notable for the following components:   Glucose-Capillary 521 (*)    All other components within normal limits  CBG MONITORING, ED - Abnormal; Notable for the following components:   Glucose-Capillary 586 (*)    All other components within normal limits  URINALYSIS, ROUTINE W REFLEX MICROSCOPIC  BETA-HYDROXYBUTYRIC ACID  BETA-HYDROXYBUTYRIC ACID  BETA-HYDROXYBUTYRIC ACID  BETA-HYDROXYBUTYRIC ACID  EKG  EKG Interpretation Date/Time:  Wednesday April 12 2024 01:05:10 EDT Ventricular Rate:  106 PR Interval:  132 QRS Duration:  102 QT Interval:  354 QTC Calculation: 471 R Axis:   77  Text Interpretation: Sinus tachycardia Biatrial enlargement Borderline prolonged QT interval Confirmed by Trine Likes [phone]) on 04/12/2024 1:10:21 AM       Radiology No results found.  Medications Ordered in ED Medications  insulin  regular, human (MYXREDLIN ) 100 units/ 100 mL infusion (17 Units/hr Intravenous New Bag/Given 04/12/24 0321)  lactated ringers  infusion (has no administration in time range)  dextrose  5 % in lactated ringers  infusion (has no administration in time range)  dextrose  50 % solution 0-50 mL (has no administration in time  range)  propofol  (DIPRIVAN ) 1000 MG/100ML infusion (5 mcg/kg/min  81.6 kg Intravenous New Bag/Given 04/12/24 0304)  fentaNYL  (SUBLIMAZE ) injection 50 mcg (has no administration in time range)  fentaNYL  (SUBLIMAZE ) injection 50-200 mcg (has no administration in time range)  lactated ringers  bolus 1,632 mL (1,632 mLs Intravenous New Bag/Given 04/12/24 0113)  prochlorperazine  (COMPAZINE ) injection 10 mg (10 mg Intravenous Given 04/12/24 0227)  diazepam  (VALIUM ) injection 2.5 mg (2.5 mg Intravenous Given 04/12/24 0226)  rocuronium  (ZEMURON ) injection 100 mg (100 mg Intravenous Given 04/12/24 0249)  etomidate  (AMIDATE ) injection 20 mg (20 mg Intravenous Given 04/12/24 0249)   Procedures .1-3 Lead EKG Interpretation  Performed by: Trine Likes Moder, MD Authorized by: Trine Likes Moder, MD     Interpretation: abnormal     ECG rate:  121   ECG rate assessment: tachycardic     Rhythm: sinus rhythm     Ectopy: none     Conduction: normal   Procedure Name: Intubation Date/Time: 04/12/2024 3:03 AM  Performed by: Trine Likes Moder, MDPre-anesthesia Checklist: Patient identified, Patient being monitored, Emergency Drugs available, Timeout performed and Suction available Oxygen Delivery Method: Non-rebreather mask Preoxygenation: Pre-oxygenation with 100% oxygen Induction Type: Rapid sequence Ventilation: Mask ventilation without difficulty Laryngoscope Size: Glidescope Grade View: Grade I Tube size: 7.5 mm Number of attempts: 1 Placement Confirmation: ETT inserted through vocal cords under direct vision, CO2 detector and Breath sounds checked- equal and bilateral Secured at: 25 cm Tube secured with: ETT holder    .Critical Care  Performed by: Trine Likes Moder, MD Authorized by: Trine Likes Moder, MD   Critical care provider statement:    Critical care time (minutes):  45   Critical care time was exclusive of:  Separately billable procedures and treating other patients    Critical care was necessary to treat or prevent imminent or life-threatening deterioration of the following conditions:  Metabolic crisis   Critical care was time spent personally by me on the following activities:  Development of treatment plan with patient or surrogate, discussions with consultants, evaluation of patient's response to treatment, examination of patient, obtaining history from patient or surrogate, review of old charts, re-evaluation of patient's condition, pulse oximetry, ordering and review of radiographic studies, ordering and review of laboratory studies and ordering and performing treatments and interventions   Care discussed with: admitting provider     (including critical care time) Medical Decision Making / ED Course   Medical Decision Making Amount and/or Complexity of Data Reviewed Labs: ordered. Decision-making details documented in ED Course. Radiology: ordered. ECG/medicine tests: ordered and independent interpretation performed. Decision-making details documented in ED Course.  Risk Prescription drug management. Drug therapy requiring intensive monitoring for toxicity. Decision regarding hospitalization. Diagnosis or treatment significantly limited by social determinants of health.  Clinical Course as of 04/12/24 0727  Wed Apr 12, 2024  0110 Hyperglycemia  Patient presentation is concerning for DKA. Endo tool labs and protocol initiated. [PC]  0150 Labs confirm DKA.  Patient is  restless and trying to get out of bed.  He pulled out his IV. [PC]  0220 Patient will require medicines to try to calm him down in order to expedite care. [PC]  [id number] Patient has become more altered, pulling off IV inhibiting appropriate treatment.  He requires chemical sedation and intubation to better care for the patient. [PC]  9689 Paged ICU team for admission [PC]  [phone] Spoke with Dr. Maree from ICU who will admit. [PC]    Clinical Course User Index [PC] Akshitha Culmer, Raynell Moder, MD    Final Clinical Impression(s) / ED Diagnoses Final diagnoses:  Diabetic ketoacidosis with coma associated with type 1 diabetes mellitus (HCC)    This chart was dictated using voice recognition software.  Despite best efforts to proofread,  errors can occur which can change the documentation meaning.    Trine Raynell Moder, MD 04/12/24 [phone]

## 2024-04-12 NOTE — ED Triage Notes (Signed)
 Patient BIB EMS for evaluation of Hyperglycemia. Patient reports blood sugar was increased this morning 350 this evening and he gave himself 40 units of insulin . On EMS arrival patient FSBS remained 350. Recheck on FSBS on the wayt to hospital was 415. Patient received 400mL NS en route. Patient tachypneic and drowsy on arrival

## 2024-04-12 NOTE — H&P (Signed)
 NAME:  Fred Acevedo, MRN:  [id number], DOB:  1996/02/02, LOS: 0 ADMISSION DATE:  04/12/2024, CONSULTATION DATE:  04/12/2024 REFERRING MD:  Raynell Naegeli, MD, CHIEF COMPLAINT:  DKA/Acute resp failure  History of Present Illness:  28 y/o male with PMH for IDDM who was BIBEMS for hyperglycemia.  Apparently had hyperglycemia all day ranging from 350 to 415.  He has run out of his short acting Insulin  and so he took 40 units of his Mother's short acting Insulin .  His Blood glucose remained high.  EMS gave him 400 cc NS en route to ER.   During his ED stay, patient became more altered and would get on the floor.  He was therefore intubated for airways protection. Pertinent  Medical History  IDDM since age 12  Significant Hospital Events: Including procedures, antibiotic start and stop dates in addition to other pertinent events   8/6:  Admit to ICU  Interim History / Subjective:  N/a  Objective    Blood pressure (!) 132/58, pulse (!) 112, resp. rate 17, height 5' 6 (1.676 m), weight 81.6 kg, SpO2 100%.    Vent Mode: PRVC FiO2 (%):  [40 %] 40 % Set Rate:  [15 bmp] 15 bmp Vt Set:  [400 mL] 400 mL PEEP:  [5 cmH20] 5 cmH20 Plateau Pressure:  [14 cmH20] 14 cmH20   Intake/Output Summary (Last 24 hours) at 04/12/2024 0354 Last data filed at 04/12/2024 0348 Gross per 24 hour  Intake 17.43 ml  Output --  Net 17.43 ml   Filed Weights   04/12/24 0053  Weight: 81.6 kg    Examination: General: sedated and intubated, NAD HENT: Pupils reactive no icterus, ETT in place Lungs: CTA no wheezes no rales Cardiovascular: reg s1s2 no gallops or rubs Abdomen: soft nt nd bs pos no guarding  Extremities: no cyanosis, clubbing or edema Neuro: sedated and intubated GU: Foley cath pos  Resolved problem list   Assessment and Plan  DKA Currently on 16 uits and hr Insulin  drip Plan to titrate Insulin  drip to achieve euglycemia IV fluids Acute hypoxic respiratory failure Intubated for airways  protection Vent management AMS Likely metabolic encephalopathy AG MA From his DKA IV fluids Hyperkalemia From the severe acidosis Should resolve with improved hydration  Best Practice (right click and Reselect all SmartList Selections daily)   Diet/type: tubefeeds DVT prophylaxis prophylactic heparin   Pressure ulcer(s): N/A GI prophylaxis: H2B Lines: N/A Foley:  Yes, and it is still needed Code Status:  full code   Labs   CBC: Recent Labs  Lab 04/12/24 0118 04/12/24 0123  WBC 30.6*  --   NEUTROABS 26.0*  --   HGB 14.5 15.3  15.6  HCT 42.9 45.0  46.0  MCV 89.9  --   PLT 296  --     Basic Metabolic Panel: Recent Labs  Lab 04/12/24 0118 04/12/24 0123  NA 132* 132*  131*  K 6.0* 5.7*  5.6*  CL 96* 105  CO2 <7*  --   GLUCOSE 558* 589*  BUN 19 19  CREATININE 1.98* 1.40*  CALCIUM 9.7  --    GFR: Estimated Creatinine Clearance: 80.9 mL/min (A) (by C-G formula based on SCr of 1.4 mg/dL (H)). Recent Labs  Lab 04/12/24 0118  WBC 30.6*    Liver Function Tests: Recent Labs  Lab 04/12/24 0118  AST 33  ALT 29  ALKPHOS 76  BILITOT 2.6*  PROT 8.0  ALBUMIN 4.6   No results for input(s): LIPASE, AMYLASE in the  last 168 hours. No results for input(s): AMMONIA in the last 168 hours.  ABG    Component Value Date/Time   HCO3 4.3 (L) 04/12/2024 0123   TCO2 <5 (L) 04/12/2024 0123   TCO2 7 (L) 04/12/2024 0123   ACIDBASEDEF 25.0 (H) 04/12/2024 0123   O2SAT 87 04/12/2024 0123     Coagulation Profile: No results for input(s): INR, PROTIME in the last 168 hours.  Cardiac Enzymes: No results for input(s): CKTOTAL, CKMB, CKMBINDEX, TROPONINI in the last 168 hours.  HbA1C: Hemoglobin A1C  Date/Time Value Ref Range Status  08/24/2011 11:09 AM 11.7  Final    Comment:    checks 3x daily (PM 218)  07/29/2011 11:13 AM >13.0  Final    Comment:    checks 2xday. PM HI.  Trace ketones.   Hgb A1c MFr Bld  Date/Time Value Ref Range Status   05/26/2017 10:53 AM 9.1 (H) 4.6 - 6.5 % Final    Comment:    Glycemic Control Guidelines for People with Diabetes:Non Diabetic:  <6%Goal of Therapy: <7%Additional Action Suggested:  >8%   07/29/2011 03:00 PM 11.1 (H) <5.7 % Final    Comment:    (NOTE)                                                                       According to the ADA Clinical Practice Recommendations for 2011, when HbA1c is used as a screening test:  >=6.5%   Diagnostic of Diabetes Mellitus           (if abnormal result is confirmed) 5.7-6.4%   Increased risk of developing Diabetes Mellitus References:Diagnosis and Classification of Diabetes Mellitus,Diabetes Care,2011,34(Suppl 1):S62-S69 and Standards of Medical Care in         Diabetes - 2011,Diabetes Care,2011,34 (Suppl 1):S11-S61.    CBG: Recent Labs  Lab 04/12/24 0104 04/12/24 0230 04/12/24 0317 04/12/24 0343  GLUCAP 530* 521* 586* 542*    Review of Systems:   Intubated and sedated unable to perform ROS  Past Medical History:  He,  has a past medical history of Diabetes mellitus, Eczema, Goiter, Hypoglycemia associated with diabetes (HCC), Hypoglycemia associated with diabetes (HCC), and Physical growth delay.   Surgical History:  No past surgical history on file.   Social History:   reports that he has never smoked. He has never used smokeless tobacco.   Family History:  His family history includes Diabetes in his maternal grandmother; Hypothyroidism in his maternal grandmother; Lupus in his sister; Sickle cell anemia in his mother; Stroke in his mother; Tourette syndrome in his brother.   Allergies No Known Allergies   Home Medications  Prior to Admission medications   Medication Sig Start Date End Date Taking? Authorizing Provider  insulin  aspart (NOVOLOG ) 100 UNIT/ML FlexPen Inject into the skin 3 (three) times daily with meals. Injecting 1 g per 8 carbs    [provider]  Insulin  Glargine (LANTUS ) 100 UNIT/ML Solostar Pen  Inject into the skin daily at 10 pm. Injecting 50 g at bedtime    [provider]     Critical care time: 22   The patient is critically ill with multiple organ system failure and requires high complexity decision making for assessment and support, frequent  evaluation and titration of therapies, advanced monitoring, review of radiographic studies and interpretation of complex data.   Critical Care Time devoted to patient care services, exclusive of separately billable procedures, described in this note is 37 minutes.   Orlin Fairly, MD [facility] See Amion for pager  If no response to pager , please call [phone] until 7pm After 7:00 pm call Elink  [phone] 04/12/2024, 3:54 AM

## 2024-04-12 NOTE — ED Notes (Signed)
 Pt unable to void at this time.  KM

## 2024-04-12 NOTE — Progress Notes (Signed)
 Initial Nutrition Assessment  DOCUMENTATION CODES:   Not applicable  INTERVENTION:  If unable to extubate, recommend initiation of tube feeding pending medical stability.   Monitor for extubation and ability to resume oral diet.   NUTRITION DIAGNOSIS:   Inadequate oral intake related to acute illness as evidenced by NPO status  GOAL:   Patient will meet greater than or equal to 90% of their needs  MONITOR:   Vent status, Labs, Weight trends  REASON FOR ASSESSMENT:   Consult, Ventilator Assessment of nutrition requirement/status  ASSESSMENT:   Pt admitted with DKA and acute respiratory failure. PMH significant for type 1 diabetes.   8/6: intubated for airway protection d/t AMS post hyperglycemia  Patient is currently intubated on ventilator support MV: 8.9 L/min Temp (24hrs), Avg:97.8 F (36.6 C), Min:97.5 F (36.4 C), Max:98 F (36.7 C)  Checked in with patient at bedside. No family/visitors present.  Unable to obtain nutrition related history.  Remains on endotool for blood sugar management.   No plans to initiate nutrition support today while trying to manage blood sugar.   No weight history on file to review within the last year.   Medications: colace BID, miralax  daily,  Drips: D5 in LR  with KCl@ 150ml/hr Insulin  gtt LR with KCl @1250ml /hr Potassium chloride  Propofol  @ 34.28ml/hr (providing 906kcal from lipid per day at current rate)  Labs:  Sodium 130 Potassium 3.4 BUN 31 Cr 2.25 Anion gap 18 GFR 40 Beta-hydroxybutyric acid 7.96 CBG's 422 - >600 x8 hours  NUTRITION - FOCUSED PHYSICAL EXAM:  Flowsheet Row Most Recent Value  Orbital Region No depletion  Upper Arm Region No depletion  Thoracic and Lumbar Region No depletion  Buccal Region No depletion  Temple Region No depletion  Clavicle Bone Region No depletion  Clavicle and Acromion Bone Region No depletion  Scapular Bone Region No depletion  Dorsal Hand Unable to assess  [handmits]   Patellar Region No depletion  Anterior Thigh Region No depletion  Posterior Calf Region No depletion  Edema (RD Assessment) None  Hair Reviewed  Eyes Unable to assess  Mouth Unable to assess  Skin Reviewed  Nails Unable to assess    Diet Order:   Diet Order             Diet NPO time specified  Diet effective now                   EDUCATION NEEDS:   Not appropriate for education at this time  Skin:  Skin Assessment: Reviewed RN Assessment  Last BM:  unknown  Height:   Ht Readings from Last 1 Encounters:  04/12/24 5' 6 (1.676 m)    Weight:   Wt Readings from Last 1 Encounters:  04/12/24 76.4 kg   BMI:  Body mass index is 27.19 kg/m.  Estimated Nutritional Needs:   Kcal:  1900-2100  Protein:  100-115g  Fluid:  >/=1.9L  Royce Maris, RDN, LDN Clinical Nutrition See AMiON for contact information.

## 2024-04-13 ENCOUNTER — Inpatient Hospital Stay (HOSPITAL_COMMUNITY): Payer: Self-pay

## 2024-04-13 LAB — BASIC METABOLIC PANEL WITH GFR
Anion gap: 10 (ref 5–15)
Anion gap: 10 (ref 5–15)
Anion gap: 17 — ABNORMAL HIGH (ref 5–15)
Anion gap: 7 (ref 5–15)
BUN: 11 mg/dL (ref 6–20)
BUN: 15 mg/dL (ref 6–20)
BUN: 16 mg/dL (ref 6–20)
BUN: 19 mg/dL (ref 6–20)
CO2: 14 mmol/L — ABNORMAL LOW (ref 22–32)
CO2: 17 mmol/L — ABNORMAL LOW (ref 22–32)
CO2: 20 mmol/L — ABNORMAL LOW (ref 22–32)
CO2: 22 mmol/L (ref 22–32)
Calcium: 8.3 mg/dL — ABNORMAL LOW (ref 8.9–10.3)
Calcium: 8.6 mg/dL — ABNORMAL LOW (ref 8.9–10.3)
Calcium: 8.6 mg/dL — ABNORMAL LOW (ref 8.9–10.3)
Calcium: 8.8 mg/dL — ABNORMAL LOW (ref 8.9–10.3)
Chloride: 108 mmol/L (ref 98–111)
Chloride: 110 mmol/L (ref 98–111)
Chloride: 111 mmol/L (ref 98–111)
Chloride: 112 mmol/L — ABNORMAL HIGH (ref 98–111)
Creatinine, Ser: 1.15 mg/dL (ref 0.61–1.24)
Creatinine, Ser: 1.15 mg/dL (ref 0.61–1.24)
Creatinine, Ser: 1.18 mg/dL (ref 0.61–1.24)
Creatinine, Ser: 1.19 mg/dL (ref 0.61–1.24)
GFR, Estimated: >60 mL/min (ref >60)
GFR, Estimated: >60 mL/min (ref >60)
GFR, Estimated: >60 mL/min (ref >60)
GFR, Estimated: >60 mL/min (ref >60)
Glucose, Bld: 141 mg/dL — ABNORMAL HIGH (ref 70–99)
Glucose, Bld: 149 mg/dL — ABNORMAL HIGH (ref 70–99)
Glucose, Bld: 209 mg/dL — ABNORMAL HIGH (ref 70–99)
Glucose, Bld: 259 mg/dL — ABNORMAL HIGH (ref 70–99)
Potassium: 3.9 mmol/L (ref 3.5–5.1)
Potassium: 5.1 mmol/L (ref 3.5–5.1)
Potassium: 5.3 mmol/L — ABNORMAL HIGH (ref 3.5–5.1)
Potassium: 5.7 mmol/L — ABNORMAL HIGH (ref 3.5–5.1)
Sodium: 138 mmol/L (ref 135–145)
Sodium: 139 mmol/L (ref 135–145)
Sodium: 140 mmol/L (ref 135–145)
Sodium: 141 mmol/L (ref 135–145)

## 2024-04-13 LAB — GLUCOSE, CAPILLARY
Glucose-Capillary: 104 mg/dL — ABNORMAL HIGH (ref 70–99)
Glucose-Capillary: 106 mg/dL — ABNORMAL HIGH (ref 70–99)
Glucose-Capillary: 120 mg/dL — ABNORMAL HIGH (ref 70–99)
Glucose-Capillary: 133 mg/dL — ABNORMAL HIGH (ref 70–99)
Glucose-Capillary: 142 mg/dL — ABNORMAL HIGH (ref 70–99)
Glucose-Capillary: 145 mg/dL — ABNORMAL HIGH (ref 70–99)
Glucose-Capillary: 150 mg/dL — ABNORMAL HIGH (ref 70–99)
Glucose-Capillary: 158 mg/dL — ABNORMAL HIGH (ref 70–99)
Glucose-Capillary: 190 mg/dL — ABNORMAL HIGH (ref 70–99)
Glucose-Capillary: 199 mg/dL — ABNORMAL HIGH (ref 70–99)
Glucose-Capillary: 223 mg/dL — ABNORMAL HIGH (ref 70–99)
Glucose-Capillary: 229 mg/dL — ABNORMAL HIGH (ref 70–99)
Glucose-Capillary: 273 mg/dL — ABNORMAL HIGH (ref 70–99)
Glucose-Capillary: 69 mg/dL — ABNORMAL LOW (ref 70–99)
Glucose-Capillary: 75 mg/dL (ref 70–99)
Glucose-Capillary: 77 mg/dL (ref 70–99)
Glucose-Capillary: 78 mg/dL (ref 70–99)

## 2024-04-13 LAB — TRIGLYCERIDES: Triglycerides: 69 mg/dL (ref <150)

## 2024-04-13 LAB — POCT I-STAT 7, (LYTES, BLD GAS, ICA,H+H)
Acid-base deficit: 4 mmol/L — ABNORMAL HIGH (ref 0.0–2.0)
Bicarbonate: 22.5 mmol/L (ref 20.0–28.0)
Calcium, Ion: 1.32 mmol/L (ref 1.15–1.40)
HCT: 31 % — ABNORMAL LOW (ref 39.0–52.0)
Hemoglobin: 10.5 g/dL — ABNORMAL LOW (ref 13.0–17.0)
O2 Saturation: 96 %
Patient temperature: 98.1
Potassium: 4.9 mmol/L (ref 3.5–5.1)
Sodium: 142 mmol/L (ref 135–145)
TCO2: 24 mmol/L (ref 22–32)
pCO2 arterial: 48.3 mmHg — ABNORMAL HIGH (ref 32–48)
pH, Arterial: 7.274 — ABNORMAL LOW (ref 7.35–7.45)
pO2, Arterial: 96 mmHg (ref 83–108)

## 2024-04-13 LAB — BETA-HYDROXYBUTYRIC ACID
Beta-Hydroxybutyric Acid: 0.2 mmol/L (ref 0.05–0.27)
Beta-Hydroxybutyric Acid: 0.58 mmol/L — ABNORMAL HIGH (ref 0.05–0.27)
Beta-Hydroxybutyric Acid: 0.49 mmol/L — ABNORMAL HIGH (ref 0.05–0.27)
Beta-Hydroxybutyric Acid: <0.05 mmol/L — ABNORMAL LOW (ref 0.05–0.27)
Beta-Hydroxybutyric Acid: 1.97 mmol/L — ABNORMAL HIGH (ref 0.05–0.27)

## 2024-04-13 LAB — HEMOGLOBIN A1C
Hgb A1c MFr Bld: 13.2 % — ABNORMAL HIGH (ref 4.8–5.6)
Mean Plasma Glucose: 332 mg/dL

## 2024-04-13 LAB — CBC
HCT: 29.8 % — ABNORMAL LOW (ref 39.0–52.0)
Hemoglobin: 10.5 g/dL — ABNORMAL LOW (ref 13.0–17.0)
MCH: 30.1 pg (ref 26.0–34.0)
MCHC: 35.2 g/dL (ref 30.0–36.0)
MCV: 85.4 fL (ref 80.0–100.0)
Platelets: 206 K/uL (ref 150–400)
RBC: 3.49 MIL/uL — ABNORMAL LOW (ref 4.22–5.81)
RDW: 11.3 % — ABNORMAL LOW (ref 11.5–15.5)
WBC: 16.6 K/uL — ABNORMAL HIGH (ref 4.0–10.5)
nRBC: 0 % (ref 0.0–0.2)

## 2024-04-13 LAB — MAGNESIUM: Magnesium: 2.3 mg/dL (ref 1.7–2.4)

## 2024-04-13 LAB — PHOSPHORUS: Phosphorus: 1.9 mg/dL — ABNORMAL LOW (ref 2.5–4.6)

## 2024-04-13 MED ORDER — DEXTROSE IN LACTATED RINGERS 5 % IV SOLN: INTRAVENOUS | Status: DC

## 2024-04-13 MED ORDER — ALBUTEROL SULFATE (2.5 MG/3ML) 0.083% IN NEBU
INHALATION_SOLUTION | RESPIRATORY_TRACT | Status: AC
  Administered 2024-04-13: 2.5 mg
  Filled 2024-04-13: qty 3

## 2024-04-13 MED ORDER — INSULIN REGULAR(HUMAN) IN NACL 100-0.9 UT/100ML-% IV SOLN
INTRAVENOUS | Status: DC
  Administered 2024-04-13: 1 [IU]/h via INTRAVENOUS

## 2024-04-13 MED ORDER — INSULIN ASPART 100 UNIT/ML IJ SOLN
0.0000 [IU] | Freq: Three times a day (TID) | INTRAMUSCULAR | Status: DC
  Administered 2024-04-13: 3 [IU] via SUBCUTANEOUS
  Administered 2024-04-13: 8 [IU] via SUBCUTANEOUS
  Administered 2024-04-14: 11 [IU] via SUBCUTANEOUS

## 2024-04-13 MED ORDER — INSULIN GLARGINE-YFGN 100 UNIT/ML ~~LOC~~ SOLN
15.0000 [IU] | Freq: Every day | SUBCUTANEOUS | Status: DC
  Administered 2024-04-13 – 2024-04-14 (×2): 15 [IU] via SUBCUTANEOUS
  Filled 2024-04-13 (×2): qty 0.15

## 2024-04-13 MED ORDER — ALBUTEROL SULFATE (2.5 MG/3ML) 0.083% IN NEBU
2.5000 mg | INHALATION_SOLUTION | Freq: Four times a day (QID) | RESPIRATORY_TRACT | Status: DC
  Administered 2024-04-13 – 2024-04-15 (×6): 2.5 mg via RESPIRATORY_TRACT
  Filled 2024-04-13 (×5): qty 3

## 2024-04-13 MED ORDER — INSULIN ASPART 100 UNIT/ML IJ SOLN
5.0000 [IU] | Freq: Three times a day (TID) | INTRAMUSCULAR | Status: DC
  Administered 2024-04-14: 5 [IU] via SUBCUTANEOUS

## 2024-04-13 NOTE — Plan of Care (Signed)
   Problem: Education: Goal: Ability to describe self-care measures that may prevent or decrease complications (Diabetes Survival Skills Education) will improve Outcome: Progressing Goal: Individualized Educational Video(s) Outcome: Progressing   Problem: Fluid Volume: Goal: Ability to maintain a balanced intake and output will improve Outcome: Progressing   Problem: Metabolic: Goal: Ability to maintain appropriate glucose levels will improve Outcome: Progressing   Problem: Nutritional: Goal: Maintenance of adequate nutrition will improve Outcome: Progressing Goal: Progress toward achieving an optimal weight will improve Outcome: Progressing

## 2024-04-13 NOTE — Progress Notes (Signed)
 eLink Physician-Brief Progress Note Patient Name: Fred Acevedo DOB: 1996-04-03 MRN: [id number]   Date of Service  04/13/2024  HPI/Events of Note  Patient was transitioned to sliding scale insulin  and extubated.  Diet still n.p.o.  eICU Interventions  Advance to carb modified     Intervention Category Minor Interventions: Routine modifications to care plan (e.g. PRN medications for pain, fever)  Luqman Perrelli 04/13/2024, 8:12 PM

## 2024-04-13 NOTE — Progress Notes (Signed)
 NAME:  Deran Fowers, MRN:  [id number], DOB:  1995/10/08, LOS: 1 ADMISSION DATE:  04/12/2024, CONSULTATION DATE:  04/12/2024 REFERRING MD:  Raynell Naegeli, MD, CHIEF COMPLAINT:  DKA/Acute resp failure  History of Present Illness:  28 y/o male with PMH for IDDM who was BIBEMS for hyperglycemia.  Apparently had hyperglycemia all day ranging from 350 to 415.  He has run out of his short acting Insulin  and so he took 40 units of his Mother's short acting Insulin .  His Blood glucose remained high.  EMS gave him 400 cc NS en route to ER.   During his ED stay, patient became more altered and would get on the floor.  He was therefore intubated for airways protection.  Pertinent  Medical History  IDDM since age 60  Significant Hospital Events: Including procedures, antibiotic start and stop dates in addition to other pertinent events   8/6:  Admit to ICU 8/7: CBGs improved, AG closed x3, BHB < 0.6, and bicarb >18, will transition off Endotool to LAI  Interim History / Subjective:  NAEON. Remains on Prop, Fent, insulin . Sedated. Ready to transition of Endotool to LAI. Will wean sedation to deem candidacy for extubation.  Objective    Blood pressure 97/72, pulse (!) 108, temperature 98.1 F (36.7 C), temperature source Oral, resp. rate 15, height 5' 6 (1.676 m), weight 81.7 kg, SpO2 100%.    Vent Mode: PRVC FiO2 (%):  [40 %] 40 % Set Rate:  [15 bmp-20 bmp] 20 bmp Vt Set:  [510 mL] 510 mL PEEP:  [5 cmH20] 5 cmH20 Plateau Pressure:  [15 cmH20-20 cmH20] 20 cmH20   Intake/Output Summary (Last 24 hours) at 04/13/2024 1039 Last data filed at 04/13/2024 1005 Gross per 24 hour  Intake 8607.2 ml  Output 5525 ml  Net 3082.2 ml   Filed Weights   04/12/24 0053 04/12/24 0500 04/13/24 0351  Weight: 81.6 kg 76.4 kg 81.7 kg   Examination: General: Young adult male, sedated, and intubated in NAD HEENT: NCAT. Sclera anicteric. ETT in place Cardiovascular: RRR. No M/R/G Respiratory: CTAB, normal WOB on RA.  No wheezing, crackles, rhonchi, or diminished breath sounds. Abdomen: Soft, non-tender, non-distended. Bowel sounds normoactive Extremities: Able to move all extremities. No BLE edema, no deformities or significant joint findings. Skin: Warm and dry. L arm IV infiltrated, mild erythema and warmth noted.  Neuro: Sedated  Assessment and Plan   DKA 2/2 missed insulin  doses PTA. Patient is homeless and doesn't have insurance to afford his medications.  - Will transition off insulin  gtt to 15u LAI daily and 5u SAI TID with moderate SSI - Continue Endotool for 1 hour during transition - Follow BMP - Appreciate CM assistance  Acute hypoxic respiratory failure  2/2 inability to protect the airway - in setting of above. S/p intubation in ED. - Full vent support, weaning sedation to consider extubation today as mental status allows - Bronchial hygiene  Acute metabolic encephalopathy  2/2 above - Supportive care as above  Pseudohyponatremia - improving AGMA AKI - Continue fluids - Follow BMP  Leukocytosis Improving leukocytosis today. Presumed reactive, no indication of acute infection. - Defer abx for now - Follow clinically  L arm IV Infiltration IV infiltrated which was running propofol  through it.  - Cold compress and keep arm elevated  Best Practice (right click and Reselect all SmartList Selections daily)   Diet/type: tubefeeds DVT prophylaxis prophylactic heparin   Pressure ulcer(s): N/A GI prophylaxis: H2B Lines: N/A Foley:  Yes, and it is  still needed Code Status:  full code  Critical care time: 40 min   Kathrine Melena, DO 04/13/24 10:39 AM

## 2024-04-13 NOTE — Progress Notes (Signed)
 Patient extubated per MD order with RN at bedside. Positive cuff leak. Patient suctioned by MD before extubation. Patient placed on 3L nasal cannula. Vitals stable. Pt able to speak.

## 2024-04-13 NOTE — TOC CM/SW Note (Signed)
 Transition of Care [facility]) - Inpatient Brief Assessment   Patient Details  Name: Fred Acevedo MRN: [id number] Date of Birth: November 13, 1995  Transition of Care [facility]) CM/SW Contact:    Tom-Johnson, Harvest Muskrat, RN Phone Number: 04/13/2024, 1:12 PM   Clinical Narrative:  Patient presented to the ED with CBG above 400. Patient became altered in the ED and was intubated and sedated for Airways protection. Patient admitted with DKA and Acute Respiratory Failure. Patient's insulin  gtt stopped today.  CM notified by MD that patient will need resources at discharge, patient does not have a PCP and n Medical insurance.   Patient not Medically ready for discharge.  CM will continue to follow and assess as patient progresses with care towards discharge.          Transition of Care Asessment:

## 2024-04-14 ENCOUNTER — Encounter (HOSPITAL_COMMUNITY): Payer: Self-pay

## 2024-04-14 DIAGNOSIS — R509 Fever, unspecified: Secondary | ICD-10-CM | POA: Diagnosis not present

## 2024-04-14 DIAGNOSIS — E876 Hypokalemia: Secondary | ICD-10-CM | POA: Diagnosis not present

## 2024-04-14 DIAGNOSIS — E1010 Type 1 diabetes mellitus with ketoacidosis without coma: Secondary | ICD-10-CM

## 2024-04-14 DIAGNOSIS — N179 Acute kidney failure, unspecified: Secondary | ICD-10-CM | POA: Diagnosis not present

## 2024-04-14 LAB — POCT I-STAT 7, (LYTES, BLD GAS, ICA,H+H)
Acid-base deficit: 4 mmol/L — ABNORMAL HIGH (ref 0.0–2.0)
Bicarbonate: 20 mmol/L (ref 20.0–28.0)
Calcium, Ion: 1.2 mmol/L (ref 1.15–1.40)
HCT: 28 % — ABNORMAL LOW (ref 39.0–52.0)
Hemoglobin: 9.5 g/dL — ABNORMAL LOW (ref 13.0–17.0)
O2 Saturation: 93 %
Potassium: 3.4 mmol/L — ABNORMAL LOW (ref 3.5–5.1)
Sodium: 138 mmol/L (ref 135–145)
TCO2: 21 mmol/L — ABNORMAL LOW (ref 22–32)
pCO2 arterial: 30.4 mmHg — ABNORMAL LOW (ref 32–48)
pH, Arterial: 7.426 (ref 7.35–7.45)
pO2, Arterial: 65 mmHg — ABNORMAL LOW (ref 83–108)

## 2024-04-14 LAB — CBC
HCT: 27.9 % — ABNORMAL LOW (ref 39.0–52.0)
Hemoglobin: 9.7 g/dL — ABNORMAL LOW (ref 13.0–17.0)
MCH: 30.3 pg (ref 26.0–34.0)
MCHC: 34.8 g/dL (ref 30.0–36.0)
MCV: 87.2 fL (ref 80.0–100.0)
Platelets: 171 K/uL (ref 150–400)
RBC: 3.2 MIL/uL — ABNORMAL LOW (ref 4.22–5.81)
RDW: 11.6 % (ref 11.5–15.5)
WBC: 15.5 K/uL — ABNORMAL HIGH (ref 4.0–10.5)
nRBC: 0 % (ref 0.0–0.2)

## 2024-04-14 LAB — BASIC METABOLIC PANEL WITH GFR
Anion gap: 17 — ABNORMAL HIGH (ref 5–15)
BUN: 12 mg/dL (ref 6–20)
CO2: 16 mmol/L — ABNORMAL LOW (ref 22–32)
Calcium: 8.8 mg/dL — ABNORMAL LOW (ref 8.9–10.3)
Chloride: 97 mmol/L — ABNORMAL LOW (ref 98–111)
Creatinine, Ser: 1.33 mg/dL — ABNORMAL HIGH (ref 0.61–1.24)
GFR, Estimated: >60 mL/min (ref >60)
Glucose, Bld: 423 mg/dL — ABNORMAL HIGH (ref 70–99)
Potassium: 4.3 mmol/L (ref 3.5–5.1)
Sodium: 130 mmol/L — ABNORMAL LOW (ref 135–145)

## 2024-04-14 LAB — GLUCOSE, CAPILLARY
Glucose-Capillary: 333 mg/dL — ABNORMAL HIGH (ref 70–99)
Glucose-Capillary: 417 mg/dL — ABNORMAL HIGH (ref 70–99)
Glucose-Capillary: 333 mg/dL — ABNORMAL HIGH (ref 70–99)
Glucose-Capillary: 234 mg/dL — ABNORMAL HIGH (ref 70–99)
Glucose-Capillary: 170 mg/dL — ABNORMAL HIGH (ref 70–99)

## 2024-04-14 MED ORDER — INSULIN NPH (HUMAN) (ISOPHANE) 100 UNIT/ML ~~LOC~~ SUSP
10.0000 [IU] | Freq: Once | SUBCUTANEOUS | Status: AC
  Administered 2024-04-14: 10 [IU] via SUBCUTANEOUS
  Filled 2024-04-14: qty 10

## 2024-04-14 MED ORDER — FAMOTIDINE 20 MG PO TABS
20.0000 mg | ORAL_TABLET | Freq: Two times a day (BID) | ORAL | Status: DC
  Administered 2024-04-14: 20 mg via ORAL
  Filled 2024-04-14: qty 1

## 2024-04-14 MED ORDER — INSULIN GLARGINE-YFGN 100 UNIT/ML ~~LOC~~ SOLN
25.0000 [IU] | Freq: Two times a day (BID) | SUBCUTANEOUS | Status: DC
  Administered 2024-04-14 – 2024-04-15 (×2): 25 [IU] via SUBCUTANEOUS
  Filled 2024-04-14 (×4): qty 0.25

## 2024-04-14 MED ORDER — INSULIN ASPART 100 UNIT/ML IJ SOLN: 0.0000 [IU] | Freq: Every day | INTRAMUSCULAR | Status: DC

## 2024-04-14 MED ORDER — PHENOL 1.4 % MT LIQD
1.0000 | OROMUCOSAL | Status: DC | PRN
  Administered 2024-04-15: 1 via OROMUCOSAL
  Filled 2024-04-14: qty 177

## 2024-04-14 MED ORDER — INSULIN ASPART 100 UNIT/ML IJ SOLN
10.0000 [IU] | Freq: Three times a day (TID) | INTRAMUSCULAR | Status: DC
  Administered 2024-04-14 – 2024-04-15 (×3): 10 [IU] via SUBCUTANEOUS

## 2024-04-14 MED ORDER — INSULIN ASPART 100 UNIT/ML IJ SOLN
0.0000 [IU] | Freq: Three times a day (TID) | INTRAMUSCULAR | Status: DC
  Administered 2024-04-14: 20 [IU] via SUBCUTANEOUS
  Administered 2024-04-14: 7 [IU] via SUBCUTANEOUS
  Administered 2024-04-15: 11 [IU] via SUBCUTANEOUS

## 2024-04-14 MED ORDER — DOCUSATE SODIUM 100 MG PO CAPS
100.0000 mg | ORAL_CAPSULE | Freq: Two times a day (BID) | ORAL | Status: DC
  Administered 2024-04-14: 100 mg via ORAL
  Filled 2024-04-14 (×3): qty 1

## 2024-04-14 MED ORDER — INSULIN GLARGINE-YFGN 100 UNIT/ML ~~LOC~~ SOLN: 30.0000 [IU] | Freq: Every day | SUBCUTANEOUS | Status: DC

## 2024-04-14 MED ORDER — INSULIN GLARGINE-YFGN 100 UNIT/ML ~~LOC~~ SOLN
15.0000 [IU] | Freq: Once | SUBCUTANEOUS | Status: AC
  Administered 2024-04-14: 15 [IU] via SUBCUTANEOUS
  Filled 2024-04-14: qty 0.15

## 2024-04-14 MED ORDER — POTASSIUM CHLORIDE CRYS ER 20 MEQ PO TBCR
40.0000 meq | EXTENDED_RELEASE_TABLET | Freq: Once | ORAL | Status: AC
  Administered 2024-04-14: 40 meq via ORAL
  Filled 2024-04-14: qty 2

## 2024-04-14 MED ORDER — POLYETHYLENE GLYCOL 3350 17 G PO PACK
17.0000 g | PACK | Freq: Every day | ORAL | Status: DC
  Filled 2024-04-14 (×2): qty 1

## 2024-04-14 NOTE — Inpatient Diabetes Management (Signed)
 Inpatient Diabetes Program Recommendations  AACE/ADA: New Consensus Statement on Inpatient Glycemic Control (2015)  Target Ranges:  Prepandial:   less than 140 mg/dL      Peak postprandial:   less than 180 mg/dL (1-2 hours)      Critically ill patients:  140 - 180 mg/dL   Lab Results  Component Value Date   GLUCAP 234 (H) 04/14/2024   HGBA1C 13.2 (H) 04/12/2024    Review of Glycemic Control  Diabetes history: DM1 Outpatient Diabetes medications: Semglee  40 units daily, Novolog  1 unit/8-10 grams CHO + correction Current orders for Inpatient glycemic control: Semglee  25 BID, Novolog  10 TID + 0-20 TID with meals and 0-5 HS  HgbA1C - 13.2%  Inpatient Diabetes Program Recommendations:    Decrease Novolog  to 0-9 units TID with meals and 0-5 HS (Type 1 and sensitive to insulin )  Spoke with pt at bedside regarding his diabetes, HgbA1C of 13.2%. Pt states he's only been taking Semglee  (had pen in bookbag) and could not get his prescription for Novolog  (it was over $200) filled. States he doesn't have insurance as of 05/07/24, d/t no longer being eligible to be on parent's insurance. Works part-time at [employer] and looking for another job at present. Living out of his car. Needs referral to PCP and needs blood glucose meter and supplies.  Discussed glucose and A1C goals. Discussed importance of checking CBGs and maintaining good CBG control to prevent long-term and short-term complications. Explained how hyperglycemia leads to damage within blood vessels which lead to the common complications seen with uncontrolled diabetes. Stressed to the patient the importance of improving glycemic control to prevent further complications from uncontrolled diabetes. Discussed impact of nutrition, exercise, stress, sickness, and medications on diabetes control. Gives insulin  injections in arms or legs. Discussed hypoglycemia s/s and treatment. Answered all questions. Instructed pt that if he ever runs out of insulin ,  he can get NPH and R or 70/30 insulin  pens at [facility] for $44 (5 pens). Pt appreciative of visit and wanting to control his blood sugars.  Continue to follow.  Thank you. Shona Brandy, RD, LDN, CDCES Inpatient Diabetes Coordinator [phone]

## 2024-04-14 NOTE — TOC Progression Note (Signed)
 Transition of Care [facility]) - Progression Note    Patient Details  Name: Avraham Benish MRN: [id number] Date of Birth: [birth year]  Transition of [facility]) CM/SW Contact  Tom-Johnson, Fortunato Nordin Daphne, RN Phone Number: 04/14/2024, 3:16 PM  Clinical Narrative:     Patient extubated yesterday, on room air.  CM spoke with patient at bedside about post hospital transition.  Patient states he lives in his car. States he was staying with his grand aunt an things did not work out. States he is employed at [employer] but does not work enough hours to pay his bills, looking for a second job. Does not have a PCP. Has a [facility] insurance by his parent but will turn 13yrs old at the end of this month and he will no longer be eligible. CM gave patient's subscriber ID # to Admissions and was told coverage is inactive. (Subscriber ID # [id number]).  New patient establishment scheduled, info o AVS. Diabetes Coordinator following. CM sent referral to Financial Counseling for Medicaid eligibility.   Patient not Medically ready for discharge.  CM will continue to follow as patient progresses with care towards discharge.                    Expected Discharge Plan and Services                                               Social Drivers of Health (SDOH) Interventions SDOH Screenings   Tobacco Use: Low Risk  (04/12/2024)    Readmission Risk Interventions     No data to display

## 2024-04-14 NOTE — Progress Notes (Signed)
 NAME:  Fred Acevedo, MRN:  [id number], DOB:  [birth year], LOS: 2 ADMISSION DATE:  04/12/2024, CONSULTATION DATE:  04/12/2024 REFERRING MD:  Raynell Naegeli, MD, CHIEF COMPLAINT:  DKA/Acute resp failure  History of Present Illness:  28 y/o male with PMH for IDDM who was BIBEMS for hyperglycemia.  Apparently had hyperglycemia all day ranging from 350 to 415.  He has run out of his short acting Insulin  and so he took 40 units of his Mother's short acting Insulin .  His Blood glucose remained high.  EMS gave him 400 cc NS en route to ER.   During his ED stay, patient became more altered and would get on the floor.  He was therefore intubated for airways protection.  Pertinent  Medical History  IDDM since age 78  Significant Hospital Events: Including procedures, antibiotic start and stop dates in addition to other pertinent events   8/6:  Admit to ICU 8/7: CBGs improved, AG closed x3, BHB < 0.6, and bicarb >18, will transition off Endotool to LAI. Successfully extubated. 8/8: CBGs fluctuating once extubated w/ meals, increased Semglee  to 30u daily. Plan to transfer to the floor today  Interim History / Subjective:  NAEON. Remains on Prop, Fent, insulin . Sedated. Ready to transition of Endotool to LAI. Will wean sedation to deem candidacy for extubation.  Objective    Blood pressure (!) 159/97, pulse (!) 111, temperature 98.9 F (37.2 C), temperature source Oral, resp. rate (!) 21, height 5' 6 (1.676 m), weight 81.7 kg, SpO2 100%.    Vent Mode: CPAP;PSV FiO2 (%):  [40 %] 40 % Set Rate:  [14 bmp-20 bmp] 14 bmp Vt Set:  [510 mL] 510 mL PEEP:  [5 cmH20] 5 cmH20 Pressure Support:  [5 cmH20] 5 cmH20 Plateau Pressure:  [15 cmH20] 15 cmH20   Intake/Output Summary (Last 24 hours) at 04/14/2024 1014 Last data filed at 04/14/2024 1000 Gross per 24 hour  Intake 1205.45 ml  Output 3720 ml  Net -2514.55 ml   Filed Weights   04/12/24 0053 04/12/24 0500 04/13/24 0351  Weight: 81.6 kg 76.4 kg 81.7 kg    Examination: General: Young adult male awake and alert in NAD HEENT: NCAT. Sclera anicteric. Cardiovascular: RRR. No M/R/G Respiratory: CTAB, normal WOB on RA. No wheezing, crackles, rhonchi, or diminished breath sounds. Abdomen: Soft, non-tender, non-distended. Bowel sounds normoactive Extremities: Able to move all extremities. No BLE edema, no deformities or significant joint findings. Skin: Warm and dry. L arm IV infiltrated, mild erythema and warmth noted.  Neuro: AAOx3. No focal neurological deficits.  Assessment and Plan   DKA 2/2 missed insulin  doses PTA. Patient is homeless and doesn't have insurance to afford his medications. S/p Endotool, DKA resolved with normalization of BHB, AG, and HCO3 level. CBGs trended up this morning. - Increase Semglee  to 30u daily - Follow BMP - Appreciate CM assistance  Acute hypoxic respiratory failure - Resolved 2/2 inability to protect the airway - in setting of above. S/p intubation in ED. - Full vent support, weaning sedation to consider extubation today as mental status allows - Bronchial hygiene  Acute metabolic encephalopathy  2/2 above - Supportive care as above  Pseudohyponatremia - improving AGMA AKI - Continue fluids - Follow BMP  Leukocytosis - improving Improving leukocytosis today. Presumed reactive, no indication of acute infection. - Defer abx for now - Follow clinically  L arm IV Infiltration IV infiltrated which was running propofol  through it.  - Cold compress and keep arm elevated  Best Practice (right  click and Reselect all SmartList Selections daily)   Diet/type: Regular consistency (see orders) DVT prophylaxis prophylactic heparin   Pressure ulcer(s): N/A GI prophylaxis: H2B Lines: N/A Foley:  Yes, and it is still needed Code Status:  full code  Critical care time: 20 min   Kathrine Melena, DO 04/14/24 10:14 AM

## 2024-04-14 NOTE — Plan of Care (Signed)
  Problem: Education: Goal: Ability to describe self-care measures that may prevent or decrease complications (Diabetes Survival Skills Education) will improve Outcome: Progressing Goal: Individualized Educational Video(s) Outcome: Progressing   Problem: Coping: Goal: Ability to adjust to condition or change in health will improve Outcome: Progressing   Problem: Fluid Volume: Goal: Ability to maintain a balanced intake and output will improve Outcome: Progressing   Problem: Health Behavior/Discharge Planning: Goal: Ability to identify and utilize available resources and services will improve Outcome: Progressing Goal: Ability to manage health-related needs will improve Outcome: Progressing   Problem: Metabolic: Goal: Ability to maintain appropriate glucose levels will improve Outcome: Progressing   Problem: Nutritional: Goal: Maintenance of adequate nutrition will improve Outcome: Progressing Goal: Progress toward achieving an optimal weight will improve Outcome: Progressing   Problem: Skin Integrity: Goal: Risk for impaired skin integrity will decrease Outcome: Progressing   Problem: Tissue Perfusion: Goal: Adequacy of tissue perfusion will improve Outcome: Progressing   Problem: Education: Goal: Ability to describe self-care measures that may prevent or decrease complications (Diabetes Survival Skills Education) will improve Outcome: Progressing Goal: Individualized Educational Video(s) Outcome: Progressing   Problem: Cardiac: Goal: Ability to maintain an adequate cardiac output will improve Outcome: Progressing   Problem: Health Behavior/Discharge Planning: Goal: Ability to identify and utilize available resources and services will improve Outcome: Progressing Goal: Ability to manage health-related needs will improve Outcome: Progressing   Problem: Fluid Volume: Goal: Ability to achieve a balanced intake and output will improve Outcome: Progressing    Problem: Metabolic: Goal: Ability to maintain appropriate glucose levels will improve Outcome: Progressing   Problem: Nutritional: Goal: Maintenance of adequate nutrition will improve Outcome: Progressing Goal: Maintenance of adequate weight for body size and type will improve Outcome: Progressing   Problem: Respiratory: Goal: Will regain and/or maintain adequate ventilation Outcome: Progressing   Problem: Urinary Elimination: Goal: Ability to achieve and maintain adequate renal perfusion and functioning will improve Outcome: Progressing   Problem: Education: Goal: Knowledge of General Education information will improve Description: Including pain rating scale, medication(s)/side effects and non-pharmacologic comfort measures Outcome: Progressing   Problem: Health Behavior/Discharge Planning: Goal: Ability to manage health-related needs will improve Outcome: Progressing   Problem: Clinical Measurements: Goal: Ability to maintain clinical measurements within normal limits will improve Outcome: Progressing Goal: Will remain free from infection Outcome: Progressing Goal: Diagnostic test results will improve Outcome: Progressing Goal: Respiratory complications will improve Outcome: Progressing Goal: Cardiovascular complication will be avoided Outcome: Progressing   Problem: Activity: Goal: Risk for activity intolerance will decrease Outcome: Progressing   Problem: Nutrition: Goal: Adequate nutrition will be maintained Outcome: Progressing   Problem: Coping: Goal: Level of anxiety will decrease Outcome: Progressing   Problem: Elimination: Goal: Will not experience complications related to bowel motility Outcome: Progressing Goal: Will not experience complications related to urinary retention Outcome: Progressing   Problem: Pain Managment: Goal: General experience of comfort will improve and/or be controlled Outcome: Progressing   Problem: Safety: Goal:  Ability to remain free from injury will improve Outcome: Progressing   Problem: Skin Integrity: Goal: Risk for impaired skin integrity will decrease Outcome: Progressing   Problem: Safety: Goal: Non-violent Restraint(s) Outcome: Progressing

## 2024-04-14 NOTE — Progress Notes (Signed)
 Pharmacy Electrolyte Replacement  Recent Labs:  Recent Labs    04/13/24 0025 04/13/24 0816 04/13/24 1638 04/14/24 0355  K 5.1   < > 3.9 3.4*  MG 2.3  --   --   --   PHOS 1.9*  --   --   --   CREATININE 1.19   < > 1.15  --    < > = values in this interval not displayed.    Low Critical Values (K </= 2.5, Phos </= 1, Mg </= 1) Present: None  MD Contacted: N/A  Plan: Give potassium chloride  40 mEq po x 1 per protocol.   Maurilio Patten, PharmD PGY1 Pharmacy Resident [facility] 04/14/2024 7:18 AM

## 2024-04-14 NOTE — Discharge Instructions (Addendum)
 Nutrition Resources:  [employer] [facility] [address], [place] [address]  [employer] [facility] [address] [place], [place] [address]  [facility] [address] [place], [place] [address]  [facility] [address] [place], [place] [address]

## 2024-04-14 NOTE — Progress Notes (Signed)
 Nutrition Follow-up  DOCUMENTATION CODES:  Not applicable  INTERVENTION:  Transition to carb modified diet Reached out to Diabetes Coordinator regarding assistance with obtaining insulin  Provided resources in AVS for patient to access more consistent nutrition   NUTRITION DIAGNOSIS:  Inadequate oral intake related to acute illness as evidenced by NPO status. - progressing, extubated, on a diet  GOAL:  Patient will meet greater than or equal to 90% of their needs - progressing  MONITOR:  Vent status, Labs, Weight trends  REASON FOR ASSESSMENT:  Consult, Ventilator Assessment of nutrition requirement/status  ASSESSMENT:  Pt admitted with DKA and acute respiratory failure. PMH significant for type 1 diabetes.  Spoke with patient at bedside now post extubation. TOC also present initially. He reports having unstable housing and primarily living in his car. 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 He gets money with the help of some friends and family and reports that he has about $6 per day to buy a about 3-4 sandwiches from [place]. 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   He has had difficulty obtaining the insulin  he needs d/t cost and has been reliant primarily on long acting insulin . TOC working on assistance with obtaining adequate insurance coverage and coordination with setting up PCP.   Patient's biggest barrier to adequate nutrition and blood sugar management is his social situation. Informed patient RD will add food pantry resources to AVS and also recommended Child psychotherapist on [place] [facility].   Medications: colace BID, SSI 0-15 units TID, novolog  5 units TID, semglee  30 units daily, miralax  daily   Labs: Sodium 130 Cr 1.33 Anion gap 17 CBG's 223-417 x24 hours HgbA1c 13.2%  Diet Order:   Diet Order             Diet  heart healthy/carb modified Room service appropriate? Yes; Fluid consistency: Thin  Diet effective now                   EDUCATION NEEDS:  Not appropriate for education at this time  Skin:  Skin Assessment: Reviewed RN Assessment  Last BM:  unknown  Height:  Ht Readings from Last 1 Encounters:  04/12/24 5' 6 (1.676 m)    Weight:  Wt Readings from Last 1 Encounters:  04/13/24 81.7 kg   BMI:  Body mass index is 29.07 kg/m.  Estimated Nutritional Needs:   Kcal:  1900-2100  Protein:  100-115g  Fluid:  >/=1.9L  Royce Maris, RDN, LDN Clinical Nutrition See AMiON for contact information.

## 2024-04-14 NOTE — Plan of Care (Signed)
  Problem: Metabolic: Goal: Ability to maintain appropriate glucose levels will improve Outcome: Progressing   Problem: Nutritional: Goal: Maintenance of adequate nutrition will improve Outcome: Progressing   Problem: Cardiac: Goal: Ability to maintain an adequate cardiac output will improve Outcome: Progressing   Problem: Metabolic: Goal: Ability to maintain appropriate glucose levels will improve Outcome: Progressing   Problem: Respiratory: Goal: Will regain and/or maintain adequate ventilation Outcome: Progressing   Problem: Urinary Elimination: Goal: Ability to achieve and maintain adequate renal perfusion and functioning will improve Outcome: Progressing   Problem: Education: Goal: Knowledge of General Education information will improve Description: Including pain rating scale, medication(s)/side effects and non-pharmacologic comfort measures Outcome: Progressing   Problem: Clinical Measurements: Goal: Cardiovascular complication will be avoided Outcome: Progressing   Problem: Coping: Goal: Level of anxiety will decrease Outcome: Progressing   Problem: Elimination: Goal: Will not experience complications related to bowel motility Outcome: Progressing

## 2024-04-15 ENCOUNTER — Other Ambulatory Visit (HOSPITAL_COMMUNITY): Payer: Self-pay

## 2024-04-15 DIAGNOSIS — E1011 Type 1 diabetes mellitus with ketoacidosis with coma: Principal | ICD-10-CM

## 2024-04-15 DIAGNOSIS — E1065 Type 1 diabetes mellitus with hyperglycemia: Secondary | ICD-10-CM

## 2024-04-15 DIAGNOSIS — Z91148 Patient's other noncompliance with medication regimen for other reason: Secondary | ICD-10-CM

## 2024-04-15 DIAGNOSIS — N179 Acute kidney failure, unspecified: Secondary | ICD-10-CM

## 2024-04-15 DIAGNOSIS — E876 Hypokalemia: Secondary | ICD-10-CM

## 2024-04-15 LAB — CBC
HCT: 27.2 % — ABNORMAL LOW (ref 39.0–52.0)
Hemoglobin: 9.7 g/dL — ABNORMAL LOW (ref 13.0–17.0)
MCH: 30.3 pg (ref 26.0–34.0)
MCHC: 35.7 g/dL (ref 30.0–36.0)
MCV: 85 fL (ref 80.0–100.0)
Platelets: 195 K/uL (ref 150–400)
RBC: 3.2 MIL/uL — ABNORMAL LOW (ref 4.22–5.81)
RDW: 11.3 % — ABNORMAL LOW (ref 11.5–15.5)
WBC: 13.4 K/uL — ABNORMAL HIGH (ref 4.0–10.5)
nRBC: 0 % (ref 0.0–0.2)

## 2024-04-15 LAB — BASIC METABOLIC PANEL WITH GFR
Anion gap: 10 (ref 5–15)
BUN: 9 mg/dL (ref 6–20)
CO2: 22 mmol/L (ref 22–32)
Calcium: 8.7 mg/dL — ABNORMAL LOW (ref 8.9–10.3)
Chloride: 105 mmol/L (ref 98–111)
Creatinine, Ser: 0.87 mg/dL (ref 0.61–1.24)
GFR, Estimated: >60 mL/min (ref >60)
Glucose, Bld: 190 mg/dL — ABNORMAL HIGH (ref 70–99)
Potassium: 3.4 mmol/L — ABNORMAL LOW (ref 3.5–5.1)
Sodium: 137 mmol/L (ref 135–145)

## 2024-04-15 LAB — GLUCOSE, CAPILLARY
Glucose-Capillary: 288 mg/dL — ABNORMAL HIGH (ref 70–99)
Glucose-Capillary: 197 mg/dL — ABNORMAL HIGH (ref 70–99)

## 2024-04-15 MED ORDER — LANCETS MISC
1.0000 | Freq: Three times a day (TID) | 0 refills | Status: AC
  Filled 2024-04-15: qty 100, 33d supply, fill #0

## 2024-04-15 MED ORDER — LANCET DEVICE MISC
1.0000 | Freq: Three times a day (TID) | 0 refills | Status: AC
  Filled 2024-04-15: qty 1, fill #0

## 2024-04-15 MED ORDER — INSULIN ASPART 100 UNIT/ML FLEXPEN
PEN_INJECTOR | SUBCUTANEOUS | 0 refills | Status: DC
  Filled 2024-04-15: qty 3, 20d supply, fill #0

## 2024-04-15 MED ORDER — BLOOD GLUCOSE MONITOR SYSTEM W/DEVICE KIT
PACK | 0 refills | Status: AC
  Filled 2024-04-15: qty 1, 30d supply, fill #0

## 2024-04-15 MED ORDER — BLOOD GLUCOSE TEST VI STRP
1.0000 | ORAL_STRIP | Freq: Three times a day (TID) | 0 refills | Status: AC
  Filled 2024-04-15: qty 100, 34d supply, fill #0

## 2024-04-15 MED ORDER — BASAGLAR KWIKPEN 100 UNIT/ML ~~LOC~~ SOPN
35.0000 [IU] | PEN_INJECTOR | Freq: Every day | SUBCUTANEOUS | 11 refills | Status: AC
  Filled 2024-04-15: qty 3, 8d supply, fill #0

## 2024-04-15 MED ORDER — ALBUTEROL SULFATE (2.5 MG/3ML) 0.083% IN NEBU: 2.5000 mg | INHALATION_SOLUTION | RESPIRATORY_TRACT | Status: DC | PRN

## 2024-04-15 MED ORDER — INSULIN PEN NEEDLE 32G X 4 MM MISC
1.0000 | Freq: Three times a day (TID) | 0 refills | Status: AC
  Filled 2024-04-15 (×2): qty 100, 33d supply, fill #0

## 2024-04-15 MED ORDER — INSULIN GLARGINE-YFGN 100 UNIT/ML ~~LOC~~ SOPN
35.0000 [IU] | PEN_INJECTOR | Freq: Every day | SUBCUTANEOUS | 0 refills | Status: DC
  Filled 2024-04-15: qty 15, 42d supply, fill #0

## 2024-04-15 NOTE — Discharge Summary (Addendum)
 Physician Discharge Summary   Patient: Fred Acevedo MRN: [id number] DOB: 1996/03/07  Admit date:     04/12/2024  Discharge date: 04/15/24  Discharge Physician: Carliss LELON Canales   PCP: Patient, No Pcp Per   Recommendations at discharge:    Pt to be discharged home.   If you experience worsening fever, chills, chest pain, shortness of breath, or other concerning symptoms, please call your PCP or go to the emergency department immediately.  Discharge Diagnoses: Principal Problem:   DKA (diabetic ketoacidosis) (HCC)  Resolved Problems:   * No resolved hospital problems. *   Hospital Course:  28 y/o male with PMH for IDDM who was BIBEMS for hyperglycemia.  Apparently had hyperglycemia all day ranging from 350 to 415.  He has run out of his short acting Insulin  and so he took 40 units of his Mother's short acting Insulin .  His Blood glucose remained high.  EMS gave him 400 cc NS en route to ER.   During his ED stay, patient became more altered and would get on the floor.  He was therefore intubated for airways protection.  Assessment and Plan:  Diabetic ketoacidosis - Initial pH 7.00, elevated blood glucose, beta hydroxybutyrate.  Intubated for airway protection, placed on IV insulin  drip, DKA protocol.  Subsequently showed resolution of acidemia and hyperglycemia.  Transition to subcu insulin  regiment.  Extubated 8/7 without complications.  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  Had run out of his insulin  previously.  Will prescribe long-acting insulin  glargine 35 units as well as insulin  aspart with sliding scale.  Works closely with pharmacy and patient on requiring medications prior to discharge.  Vital the patient follow-up closely with your primary care provider in manage is diabetes closely.  Informed patient about long-term consequences of uncontrolled diabetes including advanced kidney disease, blindness,  neuropathy, and increased cardiovascular risk of stroke and heart attack.   Recommend yearly foot/eye/urine protein exams.  Acute kidney injury - Resolved after IV fluid resuscitation.  Acute metabolic encephalopathy - Secondary to DKA.  Resolved.  Hypokalemia - Replenishment received.  Resolved.     Consultants: PCCM Procedures performed: None Disposition: Home Diet recommendation:  Discharge Diet Orders (From admission, onward)     Start     Ordered   04/15/24 0000  Diet - low sodium heart healthy        04/15/24 1032           Carb modified diet  DISCHARGE MEDICATION: Allergies as of 04/15/2024   No Known Allergies      Medication List     STOP taking these medications    insulin  glargine 100 UNIT/ML Solostar Pen Commonly known as: LANTUS  Replaced by: Basaglar  KwikPen 100 UNIT/ML       TAKE these medications    Basaglar  KwikPen 100 UNIT/ML Inject 35 Units into the skin daily. Replaces: insulin  glargine 100 UNIT/ML Solostar Pen   Blood Glucose Monitor System w/Device Kit Use to monitor blood usgar levels 3 (three) times daily.   BLOOD GLUCOSE TEST STRIPS Strp 1 each by Does not apply route 3 (three) times daily. Use as directed to check blood sugar. May dispense any manufacturer covered by patient's insurance and fits patient's device.   insulin  aspart 100 UNIT/ML FlexPen Commonly known as: NOVOLOG  CBG 70 - 120: 0 units CBG 121 - 150: 3 units CBG 151 - 200: 4 units CBG 201 - 250: 7 units CBG 251 - 300: 11  units CBG 301 - 350: 15 units CBG 351 - 400: 20 units CBG > 400: call MD and obtain STAT lab verification What changed:  how to take this when to take this additional instructions   Lancet Device Misc 1 each by Does not apply route 3 (three) times daily. May dispense any manufacturer covered by patient's insurance.   Lancets Misc 1 each by Does not apply route 3 (three) times daily. Use as directed to check blood sugar. May dispense any  manufacturer covered by patient's insurance and fits patient's device.   Pen Needles 31G X 5 MM Misc 1 each by Does not apply route 3 (three) times daily. May dispense any manufacturer covered by patient's insurance.         Discharge Exam: Filed Weights   04/12/24 0500 04/13/24 0351 04/14/24 1542  Weight: 76.4 kg 81.7 kg 76.7 kg    GENERAL:  Alert, pleasant, no acute distress  HEENT:  EOMI CARDIOVASCULAR:  RRR, no murmurs appreciated RESPIRATORY:  Clear to auscultation, no wheezing, rales, or rhonchi GASTROINTESTINAL:  Soft, nontender, nondistended EXTREMITIES:  No LE edema bilaterally NEURO:  No new focal deficits appreciated SKIN:  No rashes noted PSYCH:  Appropriate mood and affect     Condition at discharge: improving  The results of significant diagnostics from this hospitalization (including imaging, microbiology, ancillary and laboratory) are listed below for reference.   Imaging Studies: DG CHEST PORT 1 VIEW Result Date: 04/13/2024 CLINICAL DATA:  Shortness of breath. EXAM: PORTABLE CHEST 1 VIEW COMPARISON:  Chest radiograph dated 04/12/2024. FINDINGS: Interval removal of endotracheal and enteric tube. There is shallow inspiration. No focal consolidation, pleural effusion or pneumothorax. The cardiac silhouette is within normal limits. No acute osseous pathology. IMPRESSION: No active disease. Electronically Signed   By: Vanetta Chou M.D.   On: 04/13/2024 16:39   DG Chest Portable 1 View Result Date: 04/12/2024 EXAM: 1 VIEW(S) XRAY OF THE CHEST 04/12/2024 03:35:00 AM COMPARISON: None available. CLINICAL HISTORY: ETT Tube placement. Encounter for intubation FINDINGS: LUNGS AND PLEURA: No focal pulmonary opacity. No pulmonary edema. No pleural effusion. No pneumothorax. HEART AND MEDIASTINUM: No acute abnormality of the cardiac and mediastinal silhouettes. LINES AND TUBES: Endotracheal Tube (ETT) tip is 3.1 cm from the carina. Subdiaphragmatic enteric tube is present.  BONES AND SOFT TISSUES: No acute osseous abnormality. IMPRESSION: 1. Endotracheal tube tip 3.1 cm from the carina. Electronically signed by: Norman Gatlin MD 04/12/2024 03:38 AM EDT RP Workstation: [id number]   DG Abdomen 1 View Result Date: 04/12/2024 EXAM: 1 VIEW XRAY OF THE ABDOMEN 04/12/2024 03:34:00 AM COMPARISON: None available. CLINICAL HISTORY: ETT Tube placement. Encounter for intubation. FINDINGS: ENTERIC TUBE TIP AND SIDE PORT IN THE STOMACH. IMPRESSION: 1. Enteric 2-tip and side port in the stomach. Electronically signed by: Norman Gatlin MD 04/12/2024 03:36 AM EDT RP Workstation: [id number]    Microbiology: Results for orders placed or performed during the hospital encounter of 04/12/24  MRSA Next Gen by PCR, Nasal     Status: None   Collection Time: 04/12/24  4:19 AM   Specimen: Nasal Mucosa; Nasal Swab  Result Value Ref Range Status   MRSA by PCR Next Gen NOT DETECTED NOT DETECTED Final    Comment: (NOTE) The GeneXpert MRSA Assay (FDA approved for NASAL specimens only), is one component of a comprehensive MRSA colonization surveillance program. It is not intended to diagnose MRSA infection nor to guide or monitor treatment for MRSA infections. Test performance is not FDA approved in patients less  than 98 years old. Performed at [facility], [address] [place], [place] [address]     Labs: CBC: Recent Labs  Lab 04/12/24 0118 04/12/24 0123 04/12/24 9362 04/13/24 0025 04/13/24 0816 04/14/24 0355 04/14/24 0415 04/15/24 0512  WBC 30.6*  --  38.1* 16.6*  --   --  15.5* 13.4*  NEUTROABS 26.0*  --   --   --   --   --   --   --   HGB 14.5   < > 13.0 10.5* 10.5* 9.5* 9.7* 9.7*  HCT 42.9   < > 38.3* 29.8* 31.0* 28.0* 27.9* 27.2*  MCV 89.9  --  88.7 85.4  --   --  87.2 85.0  PLT 296  --  297 206  --   --  171 195   < > = values in this interval not displayed.   Basic Metabolic Panel: Recent Labs  Lab 04/12/24 1617 04/12/24 1855 04/13/24 0025  04/13/24 0816 04/13/24 0829 04/13/24 1355 04/13/24 1638 04/14/24 0355 04/14/24 1123 04/15/24 0512  NA 136   < > 138   < > 139 141 140 138 130* 137  K 4.0   < > 5.1   < > 5.3* 5.7* 3.9 3.4* 4.3 3.4*  CL 108   < > 111  --  112* 110 108  --  97* 105  CO2 18*   < > 20*  --  17* 14* 22  --  16* 22  GLUCOSE 183*   < > 149*  --  141* 209* 259*  --  423* 190*  BUN 26*   < > 19  --  16 15 11   --  12 9  CREATININE 1.54*   < > 1.19  --  1.15 1.18 1.15  --  1.33* 0.87  CALCIUM 8.6*   < > 8.3*  --  8.8* 8.6* 8.6*  --  8.8* 8.7*  MG 1.7  --  2.3  --   --   --   --   --   --   --   PHOS 2.0*  --  1.9*  --   --   --   --   --   --   --    < > = values in this interval not displayed.   Liver Function Tests: Recent Labs  Lab 04/12/24 0118  AST 33  ALT 29  ALKPHOS 76  BILITOT 2.6*  PROT 8.0  ALBUMIN 4.6   CBG: Recent Labs  Lab 04/14/24 1121 04/14/24 1427 04/14/24 1543 04/14/24 2104 04/15/24 0808  GLUCAP 417* 333* 234* 170* 288*    Discharge time spent: 35 minutes.  Length of inpatient stay: 3 days  Signed: Carliss LELON Canales, DO [facility] 04/15/2024

## 2024-04-15 NOTE — TOC Transition Note (Signed)
 Transition of Care [facility]) - Discharge Note   Patient Details  Name: Benjamyn Hestand MRN: [id number] Date of Birth: 1996-02-26  [facility] CM/SW Contact:  Marval Gell, RN Phone Number: 04/15/2024, 10:49 AM   Clinical Narrative:     MATCH sent to [facility] pharmacy        Patient Goals and CMS Choice            Discharge Placement                       Discharge Plan and Services Additional resources added to the After Visit Summary for                                       Social Drivers of Health (SDOH) Interventions SDOH Screenings   Tobacco Use: Low Risk  (04/14/2024)     Readmission Risk Interventions     No data to display

## 2024-04-15 NOTE — Inpatient Diabetes Management (Signed)
 Inpatient Diabetes Program Recommendations  AACE/ADA: New Consensus Statement on Inpatient Glycemic Control (2015)  Target Ranges:  Prepandial:   less than 140 mg/dL      Peak postprandial:   less than 180 mg/dL (1-2 hours)      Critically ill patients:  140 - 180 mg/dL   Lab Results  Component Value Date   GLUCAP 288 (H) 04/15/2024   HGBA1C 13.2 (H) 04/12/2024    Review of Glycemic Control  Latest Reference Range & Units 04/14/24 07:32 04/14/24 11:21 04/14/24 14:27 04/14/24 15:43 04/14/24 21:04 04/15/24 08:08  Glucose-Capillary 70 - 99 mg/dL 666 (H) 582 (H) 666 (H) 234 (H) 170 (H) 288 (H)   Diabetes history: DM1 Outpatient Diabetes medications: Semglee  40 units daily, Novolog  1 unit/8-10 grams CHO + correction Current orders for Inpatient glycemic control: Semglee  25 BID, Novolog  10 TID + 0-20 TID with meals and 0-5 HS  HgbA1C - 13.2%  Inpatient Diabetes Program Recommendations:    Note pt received 30 units of semglee  yesterday fasting glucose 288 this am. Pt needs closer to home dose  -  Increase Semglee  to 35 units  Thanks,  Clotilda Bull RN, MSN, BC-ADM Inpatient Diabetes Coordinator Team Pager [phone] (8a-5p)

## 2024-04-22 ENCOUNTER — Emergency Department (HOSPITAL_COMMUNITY)

## 2024-04-22 ENCOUNTER — Emergency Department (HOSPITAL_COMMUNITY)
Admission: EM | Admit: 2024-04-22 | Discharge: 2024-04-23 | Disposition: A | Discharge status: Home / Self Care | Attending: Emergency Medicine | Admitting: Emergency Medicine

## 2024-04-22 ENCOUNTER — Encounter (HOSPITAL_COMMUNITY): Payer: Self-pay | Admitting: Emergency Medicine

## 2024-04-22 ENCOUNTER — Other Ambulatory Visit: Payer: Self-pay

## 2024-04-22 DIAGNOSIS — X32XXXA Exposure to sunlight, initial encounter: Secondary | ICD-10-CM | POA: Diagnosis not present

## 2024-04-22 DIAGNOSIS — R519 Headache, unspecified: Secondary | ICD-10-CM | POA: Insufficient documentation

## 2024-04-22 DIAGNOSIS — D72829 Elevated white blood cell count, unspecified: Secondary | ICD-10-CM | POA: Insufficient documentation

## 2024-04-22 DIAGNOSIS — Z794 Long term (current) use of insulin: Secondary | ICD-10-CM | POA: Diagnosis not present

## 2024-04-22 DIAGNOSIS — H53149 Visual discomfort, unspecified: Secondary | ICD-10-CM | POA: Insufficient documentation

## 2024-04-22 DIAGNOSIS — E1065 Type 1 diabetes mellitus with hyperglycemia: Secondary | ICD-10-CM | POA: Insufficient documentation

## 2024-04-22 DIAGNOSIS — L568 Other specified acute skin changes due to ultraviolet radiation: Secondary | ICD-10-CM | POA: Insufficient documentation

## 2024-04-22 MED ORDER — SODIUM CHLORIDE 0.9 % IV BOLUS
1000.0000 mL | Freq: Once | INTRAVENOUS | Status: AC
  Administered 2024-04-22: 1000 mL via INTRAVENOUS

## 2024-04-22 MED ORDER — ACETAMINOPHEN 325 MG PO TABS
650.0000 mg | ORAL_TABLET | Freq: Once | ORAL | Status: AC
  Administered 2024-04-22: 650 mg via ORAL
  Filled 2024-04-22: qty 2

## 2024-04-22 MED ORDER — METOCLOPRAMIDE HCL 5 MG/ML IJ SOLN
10.0000 mg | Freq: Once | INTRAMUSCULAR | Status: AC
  Administered 2024-04-22: 10 mg via INTRAVENOUS
  Filled 2024-04-22: qty 2

## 2024-04-22 MED ORDER — KETOROLAC TROMETHAMINE 15 MG/ML IJ SOLN
15.0000 mg | Freq: Once | INTRAMUSCULAR | Status: AC
  Administered 2024-04-22: 15 mg via INTRAVENOUS
  Filled 2024-04-22: qty 1

## 2024-04-22 NOTE — ED Triage Notes (Signed)
 Pt presents to the ED via POV with complaints of bilateral eye pain with associated headaches x 6 days. He notes waking up and the light is extremely bright. He notes wearing glasses - has not seen his optometrist in > 2 years. No vision changes, drainage, nor dizziness. A&Ox4 at this time. Denies CP or SOB.

## 2024-04-22 NOTE — ED Provider Notes (Signed)
 Fred Acevedo Provider Note   CSN: [id number] Arrival date & time: 04/22/24  2004     Patient presents with: Eye Pain   Fred Acevedo is a 28 y.o. male with hx of DMT1 who presents with concern for 6 days of photophobia, eyes hurting/pain behind the eyes, worse in the morning and at night. Making it difficult for him to drive/ perform ADLs. NO medications at home, has been rationing his short acting insulin  since recent hospitalization and intubation in context of DKA, as he states he was not given enough quantity of his insulin  at discharge. Moved from [place]  one year ago, has not established with a PCP.   No Blurry vision/double vision/dizziness/ phonophobia. No hx of migraines per patient.    HPI     Prior to Admission medications   Medication Sig Start Date End Date Taking? Authorizing Provider  insulin  aspart (NOVOLOG  FLEXPEN) 100 UNIT/ML FlexPen CBG 70 - 120: 0 units CBG 121 - 150: 3 units CBG 151 - 200: 4 units CBG 201 - 250: 7 units CBG 251 - 300: 11 units CBG 301 - 350: 15 units CBG 351 - 400: 20 units CBG > 400: call MD and obtain STAT lab verification 04/23/24  Yes Andie Mortimer R, PA-C  Blood Glucose Monitoring Suppl (BLOOD GLUCOSE MONITOR SYSTEM) w/Device KIT Use to monitor blood sugar levels 3 (three) times daily. 04/15/24   Fred Carliss ORN, DO  Glucose Blood (BLOOD GLUCOSE TEST STRIPS) STRP Use 3 (three) times daily. Use as directed to check blood sugar. 04/15/24   Fred Carliss ORN, DO  Insulin  Glargine (BASAGLAR  KWIKPEN) 100 UNIT/ML Inject 35 Units into the skin daily. 04/15/24   Fred Carliss ORN, DO  Insulin  Pen Needle 32G X 4 MM MISC Use 1 each to inject insulins 3 (three) times daily. 04/15/24   Fred Carliss ORN, DO  Lancet Device MISC 1 each by Does not apply route 3 (three) times daily. May dispense any manufacturer covered by patient's insurance. 04/15/24   Fred Carliss ORN, DO  Lancets MISC Use 3 (three) times daily. Use  as directed to check blood sugar. 04/15/24   Fred Carliss ORN, DO    Allergies: Patient has no known allergies.    Review of Systems  Eyes:  Positive for photophobia and pain.  Neurological:  Positive for headaches.    Updated Vital Signs BP 127/81 (BP Location: Right Arm)   Pulse 95   Temp 98.1 F (36.7 C) (Oral)   Resp 16   Ht 5' 6 (1.676 m)   Wt 77.1 kg   SpO2 100%   BMI 27.44 kg/m   Physical Exam Vitals and nursing note reviewed.  Constitutional:      Appearance: He is not ill-appearing or toxic-appearing.  HENT:     Head: Normocephalic and atraumatic.     Mouth/Throat:     Mouth: Mucous membranes are moist.     Pharynx: No oropharyngeal exudate or posterior oropharyngeal erythema.  Eyes:     General: Lids are normal. Vision grossly intact.        Right eye: No discharge.        Left eye: No discharge.     Extraocular Movements: Extraocular movements intact.     Conjunctiva/sclera: Conjunctivae normal.     Pupils: Pupils are equal, round, and reactive to light.     Visual Fields: Right eye visual fields normal and left eye visual fields normal.  Comments: Visual acuity 20/20 with glasses binocular vision. R monocular vision 20/25, L monocular vision 20/25.   Cardiovascular:     Rate and Rhythm: Normal rate and regular rhythm.     Pulses: Normal pulses.     Heart sounds: Normal heart sounds. No murmur heard. Pulmonary:     Effort: Pulmonary effort is normal. No respiratory distress.     Breath sounds: Normal breath sounds. No wheezing or rales.  Abdominal:     General: Bowel sounds are normal. There is no distension.     Palpations: Abdomen is soft.     Tenderness: There is no abdominal tenderness.  Musculoskeletal:        General: No deformity.     Cervical back: Neck supple. No rigidity.  Lymphadenopathy:     Cervical: No cervical adenopathy.  Skin:    General: Skin is warm and dry.     Capillary Refill: Capillary refill takes less than 2 seconds.   Neurological:     General: No focal deficit present.     Mental Status: He is alert and oriented to person, place, and time. Mental status is at baseline.     GCS: GCS eye subscore is 4. GCS verbal subscore is 5. GCS motor subscore is 6.     Cranial Nerves: Cranial nerves 2-12 are intact.     Sensory: Sensation is intact.     Motor: Motor function is intact.     Coordination: Coordination is intact.     Gait: Gait is intact.  Psychiatric:        Mood and Affect: Mood normal.     (all labs ordered are listed, but only abnormal results are displayed) Labs Reviewed  CBC WITH DIFFERENTIAL/PLATELET - Abnormal; Notable for the following components:      Result Value   WBC 13.4 (*)    RBC 3.37 (*)    Hemoglobin 10.0 (*)    HCT 32.2 (*)    Platelets 528 (*)    Neutro Abs 9.6 (*)    Monocytes Absolute 1.3 (*)    All other components within normal limits  COMPREHENSIVE METABOLIC PANEL WITH GFR - Abnormal; Notable for the following components:   Sodium 133 (*)    Chloride 97 (*)    Glucose, Bld 467 (*)    Calcium 7.3 (*)    Albumin 3.4 (*)    ALT 57 (*)    All other components within normal limits  CBG MONITORING, ED - Abnormal; Notable for the following components:   Glucose-Capillary 429 (*)    All other components within normal limits    EKG: None  Radiology: CT Head Wo Contrast Result Date: 04/22/2024 EXAM: CT HEAD WITHOUT CONTRAST 04/22/2024 11:21:47 PM TECHNIQUE: CT of the head was performed without the administration of intravenous contrast. Automated exposure control, iterative reconstruction, and/or weight based adjustment of the mA/kV was utilized to reduce the radiation dose to as low as reasonably achievable. COMPARISON: None available. CLINICAL HISTORY: Headache, sudden, severe. FINDINGS: BRAIN AND VENTRICLES: No acute hemorrhage. Gray-white differentiation is preserved. No hydrocephalus. No extra-axial collection. No mass effect or midline shift. ORBITS: No acute  abnormality. SINUSES: No acute abnormality. SOFT TISSUES AND SKULL: No acute soft tissue abnormality. No skull fracture. IMPRESSION: 1. No acute intracranial abnormality. Electronically signed by: Franky Stanford MD 04/22/2024 11:35 PM EDT RP Workstation: [id number]     Procedures   Medications Ordered in the ED  ketorolac  (TORADOL ) 15 MG/ML injection 15 mg (15 mg Intravenous Given  04/22/24 2356)  metoCLOPramide  (REGLAN ) injection 10 mg (10 mg Intravenous Given 04/22/24 2356)  acetaminophen  (TYLENOL ) tablet 650 mg (650 mg Oral Given 04/22/24 2356)  sodium chloride  0.9 % bolus 1,000 mL (0 mLs Intravenous Stopped 04/23/24 0238)  insulin  aspart (novoLOG ) injection 10 Units (10 Units Subcutaneous Given 04/23/24 0138)                                    Medical Decision Making 28 y/o male with headaches and photophobia.   Tachycardic, hypertensive on intake, VS otherwise normal. Cardiopulmonary exam and abdominal exam is reassuring. Ocular exam is reassuring.   Emergent considerations for headache include subarachnoid hemorrhage, meningitis, temporal arteritis, glaucoma, cerebral ischemia, carotid/vertebral dissection, intracranial tumor, Venous sinus thrombosis, carbon monoxide poisoning, acute or chronic subdural hemorrhage.  Other considerations include: Migraine, Cluster headache, Tension headache, Hypertension, Caffeine / alcohol / drug withdrawal, Pseudotumor cerebri, Arteriovenous malformation, Head injury, Neurocysticercosis, Post-lumbar puncture, Preeclampsia, Cervical arthritis, Refractive error causing strain, Dental abscess, Sinusitis, Otitis media, Temporomandibular joint syndrome, Depression, Somatoform disorder (eg, somatization) Trigeminal neuralgia, Glossopharyngeal neuralgia.   Amount and/or Complexity of Data Reviewed Labs: ordered.    Details: CBC with mild leukocytosis at patient's baseline, anemia at patient's baseline, CMP with hyperglycemia of 467 but normal anion gap, no AKI.   No evidence of hyperglycemic crisis.  Radiology: ordered.    Details:  CT of the head unremarkable.    Risk OTC drugs. Prescription drug management.    Patient reevaluated after migraine cocktail.  Pain improved from8/10 to 2/10 and patient now sitting up talking to me with his eyes open.  Neurologic exam remains nonfocal.  Clinical picture most consistent with migraine headache; there is not appear to be any ocular or central emergency at this time.  Patient remains PERRL, EOMI.  Regarding patient's concerns with his insulin , I was able to adjust his prescription to provide him more volume of his short acting insulin  so he will not run out.  Would recommend close outpatient follow-up for ongoing prescription management.  Clinical concern for more generally condition of this patient symptoms would warrant further ED workup and patient management is exceedingly low.  Travonte voiced understanding of his medical evaluation and treatment plan. Each of their questions answered to their expressed satisfaction.  Return precautions were given.  Patient is well-appearing, stable, and was discharged in good condition.  This chart was dictated using voice recognition software, Dragon. Despite the best efforts of this provider to proofread and correct errors, errors may still occur which can change documentation meaning.      Final diagnoses:  Acute nonintractable headache, unspecified headache type  Photosensitivity    ED Discharge Orders          Ordered    insulin  aspart (NOVOLOG  FLEXPEN) 100 UNIT/ML FlexPen        04/23/24 0236               Charlean Carneal, Pleasant SAUNDERS, PA-C 04/23/24 0251    Ruthe Cornet, DO 04/23/24 1553

## 2024-04-23 LAB — CBC WITH DIFFERENTIAL/PLATELET
Abs Immature Granulocytes: 0.05 K/uL (ref 0.00–0.07)
Basophils Absolute: 0 K/uL (ref 0.0–0.1)
Basophils Relative: 0 %
Eosinophils Absolute: 0.2 K/uL (ref 0.0–0.5)
Eosinophils Relative: 1 %
HCT: 32.2 % — ABNORMAL LOW (ref 39.0–52.0)
Hemoglobin: 10 g/dL — ABNORMAL LOW (ref 13.0–17.0)
Immature Granulocytes: 0 %
Lymphocytes Relative: 17 %
Lymphs Abs: 2.2 K/uL (ref 0.7–4.0)
MCH: 29.7 pg (ref 26.0–34.0)
MCHC: 31.1 g/dL (ref 30.0–36.0)
MCV: 95.5 fL (ref 80.0–100.0)
Monocytes Absolute: 1.3 K/uL — ABNORMAL HIGH (ref 0.1–1.0)
Monocytes Relative: 10 %
Neutro Abs: 9.6 K/uL — ABNORMAL HIGH (ref 1.7–7.7)
Neutrophils Relative %: 72 %
Platelets: 528 K/uL — ABNORMAL HIGH (ref 150–400)
RBC: 3.37 MIL/uL — ABNORMAL LOW (ref 4.22–5.81)
RDW: 13.5 % (ref 11.5–15.5)
WBC: 13.4 K/uL — ABNORMAL HIGH (ref 4.0–10.5)
nRBC: 0 % (ref 0.0–0.2)

## 2024-04-23 LAB — COMPREHENSIVE METABOLIC PANEL WITH GFR
ALT: 57 U/L — ABNORMAL HIGH (ref 0–44)
AST: 28 U/L (ref 15–41)
Albumin: 3.4 g/dL — ABNORMAL LOW (ref 3.5–5.0)
Alkaline Phosphatase: 71 U/L (ref 38–126)
Anion gap: 12 (ref 5–15)
BUN: 14 mg/dL (ref 6–20)
CO2: 24 mmol/L (ref 22–32)
Calcium: 7.3 mg/dL — ABNORMAL LOW (ref 8.9–10.3)
Chloride: 97 mmol/L — ABNORMAL LOW (ref 98–111)
Creatinine, Ser: 1.02 mg/dL (ref 0.61–1.24)
GFR, Estimated: >60 mL/min (ref >60)
Glucose, Bld: 467 mg/dL — ABNORMAL HIGH (ref 70–99)
Potassium: 5.1 mmol/L (ref 3.5–5.1)
Sodium: 133 mmol/L — ABNORMAL LOW (ref 135–145)
Total Bilirubin: 0.5 mg/dL (ref 0.0–1.2)
Total Protein: 7.1 g/dL (ref 6.5–8.1)

## 2024-04-23 LAB — CBG MONITORING, ED: Glucose-Capillary: 429 mg/dL — ABNORMAL HIGH (ref 70–99)

## 2024-04-23 MED ORDER — INSULIN ASPART 100 UNIT/ML IJ SOLN
10.0000 [IU] | Freq: Once | INTRAMUSCULAR | Status: AC
  Administered 2024-04-23: 10 [IU] via SUBCUTANEOUS
  Filled 2024-04-23: qty 0.1

## 2024-04-23 MED ORDER — NOVOLOG FLEXPEN RELION 100 UNIT/ML ~~LOC~~ SOPN: PEN_INJECTOR | SUBCUTANEOUS | 0 refills | Status: AC

## 2024-04-23 NOTE — Discharge Instructions (Addendum)
 You are seen today for your headache and sensitivity to light.  This improved after treatment of the migraine.  Additionally been given a prescription for your NovoLog  pens.  Please follow-up with the clinic listed below for ongoing care and prescription management.  Return to the ER with any severe symptoms.  Additionally please follow-up closely with the ophthalmologist listed below for more thorough ocular exam

## 2024-05-16 ENCOUNTER — Ambulatory Visit: Payer: Self-pay

## 2024-07-27 ENCOUNTER — Encounter: Payer: Self-pay | Admitting: General Practice

## 2024-07-27 ENCOUNTER — Telehealth: Payer: Self-pay | Admitting: General Practice

## 2024-07-27 ENCOUNTER — Ambulatory Visit: Admitting: General Practice

## 2024-07-27 DIAGNOSIS — Z7689 Persons encountering health services in other specified circumstances: Secondary | ICD-10-CM

## 2024-07-27 NOTE — Telephone Encounter (Signed)
 Marked patient as dismissed due to new patient no-show per note below.

## 2024-07-27 NOTE — Telephone Encounter (Signed)
-----   Message from Carrol Aurora sent at 07/27/2024 10:30 AM EST ----- Regarding: no show Patient was a no show for new patient appointment. Please do not reschedule. Thank you.  Jenette Aurora, DNP, AGNP-C 07/27/2024 10:32 AM
